# Patient Record
Sex: Male | Born: 1970 | Race: Black or African American | Hispanic: No | Marital: Single | Smoking: Never smoker
Health system: Southern US, Community
[De-identification: ages and names within clinical notes are randomized; demographics above are authoritative.]

---

## 2004-10-18 ENCOUNTER — Ambulatory Visit: Payer: Self-pay | Admitting: Physical Medicine & Rehabilitation

## 2004-10-18 ENCOUNTER — Inpatient Hospital Stay (HOSPITAL_COMMUNITY): Admission: AC | Admit: 2004-10-18 | Discharge: 2004-10-23 | Payer: Self-pay

## 2004-10-23 ENCOUNTER — Inpatient Hospital Stay (HOSPITAL_COMMUNITY)
Admission: RE | Admit: 2004-10-23 | Discharge: 2004-10-29 | Payer: Self-pay | Admitting: Physical Medicine & Rehabilitation

## 2004-12-16 ENCOUNTER — Encounter
Admission: RE | Admit: 2004-12-16 | Discharge: 2005-03-16 | Payer: Self-pay | Admitting: Physical Medicine & Rehabilitation

## 2004-12-16 ENCOUNTER — Ambulatory Visit: Payer: Self-pay | Admitting: Physical Medicine & Rehabilitation

## 2008-02-01 ENCOUNTER — Emergency Department (HOSPITAL_COMMUNITY): Admission: EM | Admit: 2008-02-01 | Discharge: 2008-02-01 | Payer: Self-pay | Admitting: Emergency Medicine

## 2008-04-01 ENCOUNTER — Emergency Department (HOSPITAL_COMMUNITY): Admission: EM | Admit: 2008-04-01 | Discharge: 2008-04-01 | Payer: Self-pay | Admitting: Emergency Medicine

## 2008-10-26 ENCOUNTER — Emergency Department (HOSPITAL_COMMUNITY): Admission: EM | Admit: 2008-10-26 | Discharge: 2008-10-26 | Payer: Self-pay | Admitting: Emergency Medicine

## 2009-11-04 ENCOUNTER — Emergency Department (HOSPITAL_COMMUNITY): Admission: EM | Admit: 2009-11-04 | Discharge: 2009-11-04 | Payer: Self-pay | Admitting: Emergency Medicine

## 2011-08-05 ENCOUNTER — Encounter (HOSPITAL_COMMUNITY): Payer: Self-pay | Admitting: Emergency Medicine

## 2011-08-05 ENCOUNTER — Emergency Department (HOSPITAL_COMMUNITY)
Admission: EM | Admit: 2011-08-05 | Discharge: 2011-08-05 | Disposition: A | Discharge status: Home / Self Care | Payer: Self-pay | Attending: Emergency Medicine | Admitting: Emergency Medicine

## 2011-08-05 DIAGNOSIS — R369 Urethral discharge, unspecified: Secondary | ICD-10-CM | POA: Insufficient documentation

## 2011-08-05 DIAGNOSIS — R599 Enlarged lymph nodes, unspecified: Secondary | ICD-10-CM | POA: Insufficient documentation

## 2011-08-05 DIAGNOSIS — Z202 Contact with and (suspected) exposure to infections with a predominantly sexual mode of transmission: Secondary | ICD-10-CM | POA: Insufficient documentation

## 2011-08-05 MED ORDER — AZITHROMYCIN 250 MG PO TABS
1000.0000 mg | ORAL_TABLET | Freq: Once | ORAL | Status: AC
  Administered 2011-08-05: 1000 mg via ORAL
  Filled 2011-08-05: qty 1

## 2011-08-05 MED ORDER — LIDOCAINE HCL 2 % IJ SOLN
1.5000 mL | Freq: Once | INTRAMUSCULAR | Status: AC
  Administered 2011-08-05: 1.5 mg

## 2011-08-05 MED ORDER — CEFTRIAXONE SODIUM 250 MG IJ SOLR
250.0000 mg | Freq: Once | INTRAMUSCULAR | Status: AC
  Administered 2011-08-05: 250 mg via INTRAMUSCULAR
  Filled 2011-08-05: qty 250

## 2011-08-05 NOTE — ED Provider Notes (Signed)
History     CSN: [id number]  Arrival date & time 08/05/11  1341   First MD Initiated Contact with Patient 08/05/11 1508      Chief Complaint  Patient presents with  . Exposure to STD    1 week post exposure, "warm sensation around penis"    (Consider location/radiation/quality/duration/timing/severity/associated sxs/prior treatment) HPI Comments: Patient reports that he is concerned that he may have a STD.  He is sexually active.  No known STD exposure.  He has had a whitish color penile discharge for the past week.  He also describes a "warm sensation".  He denies dysuria or hematuria.  No testicular or penile pain.  No fever or chills.  PMH significant for Gonorrhea.    Patient is a 41 y.o. male presenting with penile discharge. The history is provided by the patient.  Penile Discharge This is a new problem. The current episode started in the past 7 days. The problem has been unchanged. Pertinent negatives include no abdominal pain, chills, fever, myalgias, nausea, rash, urinary symptoms or vomiting. He has tried nothing for the symptoms.    History reviewed. No pertinent past medical history.  History reviewed. No pertinent past surgical history.  Family History  Problem Relation Age of Onset  . Hypertension Mother     History  Substance Use Topics  . Smoking status: Never Smoker   . Smokeless tobacco: Not on file  . Alcohol Use: Yes      Review of Systems  Constitutional: Negative for fever and chills.  Gastrointestinal: Negative for nausea, vomiting and abdominal pain.  Genitourinary: Positive for discharge. Negative for dysuria, urgency, frequency, hematuria, flank pain, decreased urine volume, penile swelling, scrotal swelling, difficulty urinating, genital sores, penile pain and testicular pain.  Musculoskeletal: Negative for myalgias.  Skin: Negative for rash.    Allergies  Review of patient's allergies indicates no known allergies.  Home Medications  No  current outpatient prescriptions on file.  BP 127/84  Pulse 63  Temp 97.9 F (36.6 C) (Oral)  Resp 18  SpO2 99%  Physical Exam  Nursing note and vitals reviewed. Constitutional: He appears well-developed and well-nourished. No distress.  HENT:  Head: Normocephalic and atraumatic.  Mouth/Throat: Oropharynx is clear and moist.  Cardiovascular: Normal rate, regular rhythm and normal heart sounds.   Pulmonary/Chest: Effort normal and breath sounds normal.  Abdominal: Soft. There is no tenderness.  Genitourinary: Testes normal and penis normal. Right testis shows no mass, no swelling and no tenderness. Right testis is descended. Left testis shows no mass, no swelling and no tenderness. Left testis is descended. Circumcised. No penile erythema or penile tenderness. No discharge found.  Lymphadenopathy:       Right: Inguinal adenopathy present.       Left: Inguinal adenopathy present.  Neurological: He is alert.  Skin: Skin is warm and dry. No rash noted. He is not diaphoretic.  Psychiatric: He has a normal mood and affect.    ED Course  Procedures (including critical care time)  Labs Reviewed - No data to display No results found.   No diagnosis found.    MDM  Patient presents with a chief complaint of penile discharge and concern that he may have a STD.  PMH significant for Gonorrhea.  Patient given prophylactic treatment with Azithromycin and Ceftriaxone.  GC/Chlamydia pending.        Pascal Lux [place], PA-C 08/05/11 1809

## 2011-08-05 NOTE — ED Notes (Signed)
Pt is concerned about STD exposure, pain in penis and warm sensation

## 2011-08-06 LAB — GC/CHLAMYDIA PROBE AMP, GENITAL
Chlamydia, DNA Probe: NEGATIVE
GC Probe Amp, Genital: NEGATIVE

## 2011-08-08 NOTE — ED Provider Notes (Signed)
Medical screening examination/treatment/procedure(s) were performed by non-physician practitioner and as supervising physician I was immediately available for consultation/collaboration.    Nelia Shi, MD 08/08/11 1030

## 2012-12-18 ENCOUNTER — Encounter (HOSPITAL_COMMUNITY): Payer: Self-pay | Admitting: Emergency Medicine

## 2012-12-18 ENCOUNTER — Emergency Department (HOSPITAL_COMMUNITY): Payer: Self-pay

## 2012-12-18 ENCOUNTER — Emergency Department (HOSPITAL_COMMUNITY)
Admission: EM | Admit: 2012-12-18 | Discharge: 2012-12-18 | Disposition: A | Discharge status: Home / Self Care | Payer: Self-pay | Attending: Emergency Medicine | Admitting: Emergency Medicine

## 2012-12-18 DIAGNOSIS — Y9389 Activity, other specified: Secondary | ICD-10-CM | POA: Insufficient documentation

## 2012-12-18 DIAGNOSIS — Y929 Unspecified place or not applicable: Secondary | ICD-10-CM | POA: Insufficient documentation

## 2012-12-18 DIAGNOSIS — S43499A Other sprain of unspecified shoulder joint, initial encounter: Secondary | ICD-10-CM | POA: Insufficient documentation

## 2012-12-18 DIAGNOSIS — X500XXA Overexertion from strenuous movement or load, initial encounter: Secondary | ICD-10-CM | POA: Insufficient documentation

## 2012-12-18 DIAGNOSIS — S46211A Strain of muscle, fascia and tendon of other parts of biceps, right arm, initial encounter: Secondary | ICD-10-CM

## 2012-12-18 MED ORDER — CYCLOBENZAPRINE HCL 10 MG PO TABS
10.0000 mg | ORAL_TABLET | Freq: Once | ORAL | Status: AC
  Administered 2012-12-18: 10 mg via ORAL
  Filled 2012-12-18: qty 1

## 2012-12-18 MED ORDER — TRAMADOL HCL 50 MG PO TABS
50.0000 mg | ORAL_TABLET | Freq: Once | ORAL | Status: AC
  Administered 2012-12-18: 50 mg via ORAL
  Filled 2012-12-18: qty 1

## 2012-12-18 MED ORDER — TRAMADOL HCL 50 MG PO TABS: 50.0000 mg | ORAL_TABLET | Freq: Four times a day (QID) | ORAL | Status: DC | PRN

## 2012-12-18 MED ORDER — CYCLOBENZAPRINE HCL 10 MG PO TABS: 10.0000 mg | ORAL_TABLET | Freq: Two times a day (BID) | ORAL | Status: DC | PRN

## 2012-12-18 NOTE — ED Provider Notes (Signed)
CSN: [id number]     Arrival date & time 12/18/12  1611 History  This chart was scribed for Emilia Beck, PA working with Toy Baker, MD by Quintella Reichert, ED Scribe. This patient was seen in room WTR8/WTR8 and the patient's care was started at 4:46 PM.   Chief Complaint  Patient presents with  . Arm Pain    The history is provided by the patient. No language interpreter was used.    HPI Comments: RILEY HALLUM is a 42 y.o. male who presents to the Emergency Department complaining of a right arm injury sustained yesterday.  Pt states that he was trying to pick up his daughter when he felt a pop in his right bicep and "my muscles went into a ball."  Since then he has had constant moderate-to-severe pain to the right bicep that is worsened by flexing the bicep.  He denies pain or injury to any other area.  He denies prior h/o similar symptoms.   History reviewed. No pertinent past medical history.  No past surgical history on file.   Family History  Problem Relation Age of Onset  . Hypertension Mother     History  Substance Use Topics  . Smoking status: Never Smoker   . Smokeless tobacco: Never Used  . Alcohol Use: Yes     Comment: weekends only     Review of Systems  All other systems reviewed and are negative.     Allergies  Review of patient's allergies indicates no known allergies.  Home Medications  No current outpatient prescriptions on file.  BP 158/90  Pulse 75  Temp(Src) 98.6 F (37 C) (Oral)  Resp 16  SpO2 99%  Physical Exam  Nursing note and vitals reviewed. Constitutional: He is oriented to person, place, and time. He appears well-developed and well-nourished. No distress.  HENT:  Head: Normocephalic and atraumatic.  Eyes: EOM are normal.  Neck: Neck supple. No tracheal deviation present.  Cardiovascular: Normal rate.   Pulmonary/Chest: Effort normal. No respiratory distress.  Musculoskeletal:  Right arm ROM at elbow limited due  to pain.  Bicep tenderness to palpation.  Proximal swelling of bicep muscle.  Neurological: He is alert and oriented to person, place, and time.  Skin: Skin is warm and dry.  Psychiatric: He has a normal mood and affect. His behavior is normal.    ED Course  Procedures (including critical care time)  DIAGNOSTIC STUDIES: Oxygen Saturation is 99% on room air, normal by my interpretation.    COORDINATION OF CARE: 4:49 PM-Discussed treatment plan which includes pain medication and orthopedic f/u with pt at bedside and pt agreed to plan.    Labs Review Labs Reviewed - No data to display  Imaging Review No results found.  EKG Interpretation   None       MDM   1. Biceps tendon rupture, right, initial encounter    Patient likely has a distal bicep tendon rupture. Patient will have a sling immobilizer and Flexeril and Tramadol and instructions to follow up with orthopedics. No neurovascular compromise. Vitals stable and patient afebrile.    I personally performed the services described in this documentation, which was scribed in my presence. The recorded information has been reviewed and is accurate.    Emilia Beck, PA-C 12/18/12 1921

## 2012-12-18 NOTE — ED Notes (Addendum)
Patient reports that he attempted to pick up his daughter he had severe pain in the right bicep. Patient able to wiggle fingers without any problems. Right radial pulse present.

## 2012-12-20 NOTE — ED Provider Notes (Signed)
Medical screening examination/treatment/procedure(s) were performed by non-physician practitioner and as supervising physician I was immediately available for consultation/collaboration.  Tenzin Edelman T Bard Haupert, MD 12/20/12 0941 

## 2014-08-31 ENCOUNTER — Encounter (HOSPITAL_COMMUNITY): Payer: Self-pay | Admitting: *Deleted

## 2014-08-31 ENCOUNTER — Emergency Department (HOSPITAL_COMMUNITY)
Admission: EM | Admit: 2014-08-31 | Discharge: 2014-08-31 | Disposition: A | Discharge status: Home / Self Care | Payer: No Typology Code available for payment source | Attending: Emergency Medicine | Admitting: Emergency Medicine

## 2014-08-31 ENCOUNTER — Emergency Department (HOSPITAL_COMMUNITY): Payer: No Typology Code available for payment source

## 2014-08-31 DIAGNOSIS — S40261A Insect bite (nonvenomous) of right shoulder, initial encounter: Secondary | ICD-10-CM | POA: Diagnosis not present

## 2014-08-31 DIAGNOSIS — S62613A Displaced fracture of proximal phalanx of left middle finger, initial encounter for closed fracture: Secondary | ICD-10-CM | POA: Diagnosis not present

## 2014-08-31 DIAGNOSIS — S40862A Insect bite (nonvenomous) of left upper arm, initial encounter: Secondary | ICD-10-CM | POA: Diagnosis not present

## 2014-08-31 DIAGNOSIS — S6992XA Unspecified injury of left wrist, hand and finger(s), initial encounter: Secondary | ICD-10-CM | POA: Diagnosis present

## 2014-08-31 DIAGNOSIS — W57XXXA Bitten or stung by nonvenomous insect and other nonvenomous arthropods, initial encounter: Secondary | ICD-10-CM | POA: Insufficient documentation

## 2014-08-31 DIAGNOSIS — S40861A Insect bite (nonvenomous) of right upper arm, initial encounter: Secondary | ICD-10-CM | POA: Diagnosis not present

## 2014-08-31 DIAGNOSIS — S40262A Insect bite (nonvenomous) of left shoulder, initial encounter: Secondary | ICD-10-CM | POA: Insufficient documentation

## 2014-08-31 DIAGNOSIS — Y9389 Activity, other specified: Secondary | ICD-10-CM | POA: Diagnosis not present

## 2014-08-31 DIAGNOSIS — S30860A Insect bite (nonvenomous) of lower back and pelvis, initial encounter: Secondary | ICD-10-CM | POA: Diagnosis not present

## 2014-08-31 DIAGNOSIS — Y92410 Unspecified street and highway as the place of occurrence of the external cause: Secondary | ICD-10-CM | POA: Diagnosis not present

## 2014-08-31 DIAGNOSIS — Y998 Other external cause status: Secondary | ICD-10-CM | POA: Insufficient documentation

## 2014-08-31 DIAGNOSIS — S62609A Fracture of unspecified phalanx of unspecified finger, initial encounter for closed fracture: Secondary | ICD-10-CM

## 2014-08-31 MED ORDER — IBUPROFEN 800 MG PO TABS: 800.0000 mg | ORAL_TABLET | Freq: Three times a day (TID) | ORAL | Status: DC

## 2014-08-31 MED ORDER — PERMETHRIN 5 % EX CREA: TOPICAL_CREAM | CUTANEOUS | Status: DC

## 2014-08-31 NOTE — ED Notes (Signed)
Declined W/C at D/C and was escorted to lobby by RN. 
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
## 2014-08-31 NOTE — Discharge Instructions (Signed)
Finger Fracture (Phalangeal) A broken bone of the finger (phalangealfracture) is a common injury for athletes. A single injury (trauma) is likely to fracture multiple bones on the same or different fingers. SYMPTOMS   Severe pain, at the time of injury.  Pain, tenderness, swelling, and later bruising of the finger and then the hand.  Visible deformity, if the fracture is complete and the bone fragments separate enough to distort the normal shape.  Numbness or coldness from swelling in the finger, causing pressure on blood vessels or nerves (uncommon). CAUSES  Direct or indirect injury (trauma) to the finger.  RISK INCREASES WITH:   Contact sports (football, rugby) or other sports where injury to the hand is likely (soccer, baseball, basketball).  Sports that require hitting (boxing, martial arts).  History of bone or joint disease, such as osteoporosis, or previous bone restraint.  Poor hand strength and flexibility. PREVENTION   For contact sports, wear appropriate and properly fitted protective equipment for the hand.  Learn and use proper technique when hitting, punching, or landing after a fall.  If you had a previous finger injury or hand restraint, use tape or padding to protect the finger when playing sports where finger injury is likely. PROGNOSIS  With proper treatment and normal alignment of the bones, healing can usually be expected in 4 to 6 weeks. Sometimes, surgery is needed.  RELATED COMPLICATIONS   Fracture does not heal (nonunion).  Bone heals in wrong position (malunion).  Chronic pain, stiffness, or swelling of the hand.  Excessive bleeding, causing pressure on nerves and blood vessels.  Unstable or arthritic joint, following repeated injury or delayed treatment.  Hindrance of normal growth in children.  Infection in skin broken over the fracture (open fracture) or at the incision or pin sites from surgery.  Shortening of injured bones.  Bony bumps  or loss of shape of the fingers.  Arthritic or stiff finger joint, if the fracture reaches the joint. TREATMENT  If the bones are properly aligned, treatment involves ice and medicine to reduce pain and inflammation. Then, the finger is restrained for 4 or more weeks, to allow for healing. If the fracture is out of alignment (displaced), involves more than one bone, or involves a joint, surgery is usually advised. Surgery often involves placing removable pins, screws, and sometimes plates, to hold the bones in proper alignment. After restraint (with or without surgery), stretching and strengthening exercises are needed. Exercises may be completed at home or with a therapist. For certain sports, wearing a splint or having the finger taped during future activity is advised.  MEDICATION   If pain medicine is needed, nonsteroidal anti-inflammatory medicines (aspirin and ibuprofen), or other minor pain relievers (acetaminophen), are often advised.  Do not take pain medicine for 7 days before surgery.  Prescription pain relievers are usually prescribed only after surgery. Use only as directed and only as much as you need. COLD THERAPY   Cold treatment (icing) relieves pain and reduces inflammation. Cold treatment should be applied for 10 to 15 minutes every 2 to 3 hours, and immediately after activity that aggravates your symptoms. Use ice packs or an ice massage. SEEK MEDICAL CARE IF:   Pain, tenderness, or swelling gets worse, despite treatment.  You experience pain, numbness, or coldness in the hand.  Blue, gray, or dark color appears in the fingernails.  Any of the following occur after surgery: fever, increased pain, swelling, redness, drainage of fluids, or bleeding in the affected area.  New,   unexplained symptoms develop. (Drugs used in treatment may produce side effects.) Document Released: 01/11/2005 Document Revised: 04/05/2011 Document Reviewed: 04/25/2008 Exit[employer] Patient  Information 2015 [employer]. This information is not intended to replace advice given to you by your health care provider. Make sure you discuss any questions you have with your health care provider.  

## 2014-09-11 ENCOUNTER — Ambulatory Visit: Payer: Self-pay

## 2014-09-11 ENCOUNTER — Other Ambulatory Visit: Payer: Self-pay | Admitting: Occupational Medicine

## 2014-09-11 DIAGNOSIS — R52 Pain, unspecified: Secondary | ICD-10-CM

## 2016-07-21 ENCOUNTER — Ambulatory Visit (HOSPITAL_COMMUNITY)
Admission: EM | Admit: 2016-07-21 | Discharge: 2016-07-21 | Disposition: A | Discharge status: Home / Self Care | Payer: Self-pay | Attending: Family Medicine | Admitting: Family Medicine

## 2016-07-21 ENCOUNTER — Encounter (HOSPITAL_COMMUNITY): Payer: Self-pay | Admitting: Emergency Medicine

## 2016-07-21 DIAGNOSIS — T63441A Toxic effect of venom of bees, accidental (unintentional), initial encounter: Secondary | ICD-10-CM

## 2016-07-21 DIAGNOSIS — S60561A Insect bite (nonvenomous) of right hand, initial encounter: Secondary | ICD-10-CM

## 2016-07-21 MED ORDER — KETOROLAC TROMETHAMINE 30 MG/ML IJ SOLN
INTRAMUSCULAR | Status: AC
  Filled 2016-07-21: qty 1

## 2016-07-21 MED ORDER — DEXAMETHASONE SODIUM PHOSPHATE 10 MG/ML IJ SOLN
10.0000 mg | Freq: Once | INTRAMUSCULAR | Status: AC
  Administered 2016-07-21: 10 mg via INTRAMUSCULAR

## 2016-07-21 MED ORDER — HYDROXYZINE HCL 25 MG PO TABS: 25.0000 mg | ORAL_TABLET | Freq: Four times a day (QID) | ORAL | 0 refills | Status: DC

## 2016-07-21 MED ORDER — KETOROLAC TROMETHAMINE 30 MG/ML IJ SOLN
30.0000 mg | Freq: Once | INTRAMUSCULAR | Status: AC
  Administered 2016-07-21: 30 mg via INTRAMUSCULAR

## 2016-07-21 MED ORDER — DEXAMETHASONE SODIUM PHOSPHATE 10 MG/ML IJ SOLN
INTRAMUSCULAR | Status: AC
  Filled 2016-07-21: qty 1

## 2016-07-21 NOTE — ED Provider Notes (Signed)
CSN: [id number]     Arrival date & time 07/21/16  1333 History   First MD Initiated Contact with Patient 07/21/16 1349     Chief Complaint  Patient presents with  . Insect Bite   (Consider location/radiation/quality/duration/timing/severity/associated sxs/prior Treatment) Patient c/o being stung by a wasp today and has swelling in his right hand and some pain.  He denies SOB or swelling in mouth or throat.   The history is provided by the patient.  Rash  Location:  Hand Hand rash location:  R hand Quality: itchiness and swelling   Onset quality:  Sudden Duration:  4 hours Timing:  Constant Progression:  Waxing and waning Chronicity:  New Context: insect bite/sting   Relieved by:  None tried   History reviewed. No pertinent past medical history. History reviewed. No pertinent surgical history. Family History  Problem Relation Age of Onset  . Hypertension Mother    Social History  Substance Use Topics  . Smoking status: Never Smoker  . Smokeless tobacco: Never Used  . Alcohol use Yes     Comment: weekends only    Review of Systems  Constitutional: Negative.   HENT: Negative.   Eyes: Negative.   Respiratory: Negative.   Cardiovascular: Negative.   Gastrointestinal: Negative.   Endocrine: Negative.   Genitourinary: Negative.   Skin: Positive for rash.  Allergic/Immunologic: Negative.   Neurological: Negative.   Hematological: Negative.   Psychiatric/Behavioral: Negative.     Allergies  Patient has no known allergies.  Home Medications   Prior to Admission medications   Medication Sig Start Date End Date Taking? Authorizing Provider  hydrOXYzine (ATARAX/VISTARIL) 25 MG tablet Take 1 tablet (25 mg total) by mouth every 6 (six) hours. 07/21/16   Deatra Canterxford, Daijah Scrivens J, FNP   Meds Ordered and Administered this Visit   Medications  ketorolac (TORADOL) 30 MG/ML injection 30 mg (30 mg Intramuscular Given 07/21/16 1400)  dexamethasone (DECADRON) injection 10 mg (10 mg  Intramuscular Given 07/21/16 1359)    BP 122/76 (BP Location: Left Arm)   Pulse 73   Temp 98.5 F (36.9 C) (Oral)   Resp 18   SpO2 100%  No data found.   Physical Exam  Constitutional: He appears well-developed and well-nourished.  HENT:  Head: Normocephalic and atraumatic.  Eyes: Conjunctivae and EOM are normal. Pupils are equal, round, and reactive to light.  Neck: Normal range of motion. Neck supple.  Cardiovascular: Normal rate, regular rhythm and normal heart sounds.   Pulmonary/Chest: Effort normal and breath sounds normal.  Skin: Rash noted.  Right hand swollen painful and itchy  Nursing note and vitals reviewed.   Urgent Care Course     Procedures (including critical care time)  Labs Review Labs Reviewed - No data to display  Imaging Review No results found.   Visual Acuity Review  Right Eye Distance:   Left Eye Distance:   Bilateral Distance:    Right Eye Near:   Left Eye Near:    Bilateral Near:         MDM   1. Bee sting, accidental or unintentional, initial encounter    Toradol 30mg  IM Dexamethasone 10mg  IM Hydroxyzine 25mg  one po qid prn #12      Deatra CanterOxford, Turon Kilmer J, FNP 07/21/16 1432

## 2016-07-21 NOTE — ED Triage Notes (Signed)
[facility] with a complaint of pain and swelling to his right hand secondary to being stung by a wasp earlier this morning.

## 2017-02-17 ENCOUNTER — Other Ambulatory Visit: Payer: Self-pay

## 2017-02-17 DIAGNOSIS — Y990 Civilian activity done for income or pay: Secondary | ICD-10-CM | POA: Insufficient documentation

## 2017-02-17 DIAGNOSIS — X500XXA Overexertion from strenuous movement or load, initial encounter: Secondary | ICD-10-CM | POA: Insufficient documentation

## 2017-02-17 DIAGNOSIS — Y9289 Other specified places as the place of occurrence of the external cause: Secondary | ICD-10-CM | POA: Insufficient documentation

## 2017-02-17 DIAGNOSIS — Y9389 Activity, other specified: Secondary | ICD-10-CM | POA: Insufficient documentation

## 2017-02-17 DIAGNOSIS — S46912A Strain of unspecified muscle, fascia and tendon at shoulder and upper arm level, left arm, initial encounter: Secondary | ICD-10-CM | POA: Insufficient documentation

## 2017-02-17 DIAGNOSIS — Z79899 Other long term (current) drug therapy: Secondary | ICD-10-CM | POA: Insufficient documentation

## 2017-02-18 ENCOUNTER — Other Ambulatory Visit: Payer: Self-pay

## 2017-02-18 ENCOUNTER — Encounter (HOSPITAL_COMMUNITY): Payer: Self-pay | Admitting: Emergency Medicine

## 2017-02-18 ENCOUNTER — Emergency Department (HOSPITAL_COMMUNITY): Payer: Self-pay

## 2017-02-18 ENCOUNTER — Emergency Department (HOSPITAL_COMMUNITY)
Admission: EM | Admit: 2017-02-18 | Discharge: 2017-02-18 | Disposition: A | Discharge status: Home / Self Care | Payer: Self-pay | Attending: Emergency Medicine | Admitting: Emergency Medicine

## 2017-02-18 DIAGNOSIS — S46912A Strain of unspecified muscle, fascia and tendon at shoulder and upper arm level, left arm, initial encounter: Secondary | ICD-10-CM

## 2017-02-18 MED ORDER — TRAMADOL HCL 50 MG PO TABS: 50.0000 mg | ORAL_TABLET | Freq: Four times a day (QID) | ORAL | 0 refills | Status: DC | PRN

## 2017-02-18 MED ORDER — IBUPROFEN 800 MG PO TABS
800.0000 mg | ORAL_TABLET | Freq: Once | ORAL | Status: AC
Start: 2017-02-18 — End: 2017-02-18
  Administered 2017-02-18: 800 mg via ORAL
  Filled 2017-02-18: qty 1

## 2017-02-18 NOTE — ED Notes (Signed)
No answer x 1 for triage

## 2017-02-18 NOTE — Discharge Instructions (Signed)
You may alternate Tylenol 1000 mg every 6 hours as needed for pain and Ibuprofen 800 mg every 8 hours as needed for pain.  Please take Ibuprofen with food. ° ° ° °To find a primary care or specialty doctor please call [phone] or [phone] to access "[facility] Find a Doctor Service." ° °You may also go on the [facility] website at [email or web address] ° °There are also multiple [facility], [facility], [facility] and [facility] practices throughout the [place] that are frequently accepting new patients. You may find a clinic that is close to your home and contact them. ° °[facility] -  °[address] °[place] [address] °[phone] ° ° °[facility] -  °[address] °[place] [address] °[phone] ° ° °[facility] - °[address]  °[place] [address] °[phone] ° ° °

## 2017-02-18 NOTE — ED Triage Notes (Signed)
Pt reports he was working today and began to experience left shoulder pain.  The pain got so bad he had to leave work.

## 2017-02-18 NOTE — ED Provider Notes (Signed)
TIME SEEN: 4:45 AM  CHIEF COMPLAINT: Left shoulder pain  HPI: Patient is a 47 year old right-hand-dominant male with no significant past medical history who presents to the emergency department with left shoulder pain.  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  He has a hard time lifting his left arm as it causes him increased pain.  Denies chest pain or shortness of breath.  No numbness, tingling or weakness.  No swelling in this arm.  No other new injury.  Did not take any medications prior to arrival.  ROS: See HPI Constitutional: no fever  Eyes: no drainage  ENT: no runny nose   Cardiovascular:  no chest pain  Resp: no SOB  GI: no vomiting GU: no dysuria Integumentary: no rash  Allergy: no hives  Musculoskeletal: no leg swelling  Neurological: no slurred speech ROS otherwise negative  PAST MEDICAL HISTORY/PAST SURGICAL HISTORY:  History reviewed. No pertinent past medical history.  MEDICATIONS:  Prior to Admission medications   Medication Sig Start Date End Date Taking? Authorizing Provider  hydrOXYzine (ATARAX/VISTARIL) 25 MG tablet Take 1 tablet (25 mg total) by mouth every 6 (six) hours. 07/21/16   Deatra Canterxford, William J, FNP    ALLERGIES:  No Known Allergies  SOCIAL HISTORY:  Social History   Tobacco Use  . Smoking status: Never Smoker  . Smokeless tobacco: Never Used  Substance Use Topics  . Alcohol use: Yes    Comment: weekends only    FAMILY HISTORY: Family History  Problem Relation Age of Onset  . Hypertension Mother     EXAM: BP 135/87 (BP Location: Right Arm)   Pulse 80   Temp 98.2 F (36.8 C) (Oral)   Resp 16   SpO2 95%  CONSTITUTIONAL: Alert and oriented and responds appropriately to questions. Well-appearing; well-nourished; GCS 15 HEAD: Normocephalic; atraumatic EYES: Conjunctivae clear, PERRL, EOMI ENT: normal nose; no rhinorrhea; moist mucous membranes; pharynx without lesions noted; no dental injury; no septal hematoma NECK:  Supple, no meningismus, no LAD; no midline spinal tenderness, step-off or deformity; trachea midline CARD: RRR; S1 and S2 appreciated; no murmurs, no clicks, no rubs, no gallops RESP: Normal chest excursion without splinting or tachypnea; breath sounds clear and equal bilaterally; no wheezes, no rhonchi, no rales; no hypoxia or respiratory distress CHEST:  chest wall stable, no crepitus or ecchymosis or deformity, mildly tender to palpation over the left pectoralis muscle without swelling, erythema, ecchymosis, warmth; no flail chest ABD/GI: Normal bowel sounds; non-distended; soft, non-tender, no rebound, no guarding; no ecchymosis or other lesions noted PELVIS:  stable, nontender to palpation BACK:  The back appears normal and is non-tender to palpation, there is no CVA tenderness; no midline spinal tenderness, step-off or deformity EXT: Tender to palpation over the anterior left shoulder without deformity.  2+ radial pulses bilaterally.  Compartments are soft.  Patient has pain with lifting the shoulder all the way up to his head but has full range of motion in this joint.  Otherwise normal ROM in all joints; otherwise extremities are non-tender to palpation; no edema; normal capillary refill; no cyanosis, no bony tenderness or bony deformity of patient's extremities, no joint effusion, compartments are soft, extremities are warm and well-perfused, no ecchymosis SKIN: Normal color for age and race; warm NEURO: Moves all extremities equally PSYCH: The patient's mood and manner are appropriate. Grooming and personal hygiene are appropriate.  MEDICAL DECISION MAKING: Patient here with left shoulder strain, sprain.  Also suspect pectoralis strain.  X-ray shows no  fracture.  No sign of dislocation.  Neurovascular intact distally.  No sign of gout, septic arthritis.  Doubt DVT or arterial obstruction.  Recommended NSAIDs, rest, stretching, ice.  We will also discharge with brief prescription of tramadol for  pain control.  Will give outpatient PCP follow-up if symptoms not improving.  I do not think this is his anginal equivalent.  Doubt ACS, PE, dissection.   At this time, I do not feel there is any life-threatening condition present. I have reviewed and discussed all results (EKG, imaging, lab, urine as appropriate) and exam findings with patient/family. I have reviewed nursing notes and appropriate previous records.  I feel the patient is safe to be discharged home without further emergent workup and can continue workup as an outpatient as needed. Discussed usual and customary return precautions. Patient/family verbalize understanding and are comfortable with this plan.  Outpatient follow-up has been provided if needed. All questions have been answered.       Jamir Rone, Layla Maw, DO 02/18/17 [phone]

## 2018-04-04 ENCOUNTER — Encounter (HOSPITAL_COMMUNITY): Payer: Self-pay

## 2018-04-04 ENCOUNTER — Ambulatory Visit (HOSPITAL_COMMUNITY)
Admission: EM | Admit: 2018-04-04 | Discharge: 2018-04-04 | Disposition: A | Discharge status: Home / Self Care | Payer: BLUE CROSS/BLUE SHIELD | Attending: Emergency Medicine | Admitting: Emergency Medicine

## 2018-04-04 DIAGNOSIS — R040 Epistaxis: Secondary | ICD-10-CM | POA: Diagnosis not present

## 2018-04-04 NOTE — Discharge Instructions (Addendum)
Apply bacitracin in your nose to keep nasal passages moist.  apply pressure if the nosebleed returns for 15 minutes, and if it does not resolve, then use 2 sprays of Afrin in each nostril and apply pressure for 30 minutes.  If it does not stop bleeding by then, go to the ER.  Below is a list of primary care practices who are taking new patients for you to follow-up with.  [facility] at [facility] [address] [place], [place] [address] [phone]  [facility] [address] [place], [place] [address] [phone]  [facility] [phone] [address] [place] [address]  [facility]: [address] [place] [address]  [phone]  [facility]: [address] [place] [address]   [phone]  [facility]: [address] [place] [address]  [phone]  [facility] : [address] [place] [address] [phone]  [facility]: [address] [place] [address] [phone]  [facility]: [address] [place] [address] [phone]  Dr. Oneal Grout [address] [facility] [place] [address]  [phone]  Dr. Jackie Plum, [facility]. [address] [place], [place] [address]  [phone]  Go to www.goodrx.com to look up your medications. This will give you a list of where you can find your prescriptions at the most affordable prices. Or ask the pharmacist what the cash price is, or if they have any other discount programs available to help make your medication more affordable. This can be less expensive than what you would pay with insurance.

## 2018-04-04 NOTE — ED Provider Notes (Signed)
HPI  SUBJECTIVE:  Paul [name] is a 48 y.o. male who presents with 3 episodes of epistaxis lasting 7 to 8 minutes each since yesterday.  Patient states that he was working when it started.  States that he had the third episode of epistaxis after blowing his nose.  He denies trauma to the nose, finger picking, headache, fevers, vomiting.  No nasal sprays such as Flonase, denies cocaine use.  States that he has had a recent URI.  He tried pinching his nose and tilting his head backwards with hemostasis.  No aggravating factors.  He denies easy bruising, hematuria, melena, hematochezia, petechial rash.  He has a past medical history of epistaxis, but never sought medical attention for this.  It does not appear to be associated with cold weather.  No history of hypertension, anticoagulant, antiplatelet use.  PMD: None.    History reviewed. No pertinent past medical history.  History reviewed. No pertinent surgical history.  Family History  Problem Relation Age of Onset  . Hypertension Mother     Social History   Tobacco Use  . Smoking status: Never Smoker  . Smokeless tobacco: Never Used  Substance Use Topics  . Alcohol use: Yes    Comment: weekends only  . Drug use: No    No current facility-administered medications for this encounter.  No current outpatient medications on file.  No Known Allergies   ROS  As noted in HPI.   Physical Exam  BP (!) 132/95 (BP Location: Right Arm)   Pulse 67   Temp 98.1 F (36.7 C) (Oral)   Resp 18   SpO2 98%   Constitutional: Well developed, well nourished, no acute distress Eyes:  EOMI, conjunctiva normal bilaterally HENT: Normocephalic, atraumatic,mucus membranes moist.  Friable, erythematous nasal mucosa.  No active bleeding.  No blood in the oropharynx..  Septum intact. Respiratory: Normal inspiratory effort Cardiovascular: Normal rate GI: nondistended skin: No rash, skin intact Musculoskeletal: no deformities Neurologic: Alert  & oriented x 3, no focal neuro deficits Psychiatric: Speech and behavior appropriate   ED Course   Medications - No data to display  No orders of the defined types were placed in this encounter.   No results found for this or any previous visit (from the past 24 hour(s)). No results found.  ED Clinical Impression  Anterior epistaxis   ED Assessment/Plan  he has no active bleeding at this point in time.  He is not hypertensive nor does he give any historical evidence of a coagulopathy.  Suspect that this is from his recent URI.  We will have him apply some bacitracin his nose to keep nasal passages moist, apply pressure if the epistaxis returns for 15 minutes, and if it does not resolve after 15 minutes, then to use 2 sprays of Afrin in each nostril and apply pressure for 30 minutes.  If it does not stop bleeding by then, he is to go to the ER.  We will have him follow-up with ENT if this becomes a recurrent problem.  Dr. Annalee Genta on call.  Also providing a primary care list for routine care.  Discussed MDM, treatment plan, and plan for follow-up with patient. Discussed sn/sx that should prompt return to the ED. patient agrees with plan.   No orders of the defined types were placed in this encounter.   *This clinic note was created using Dragon dictation software. Therefore, there may be occasional mistakes despite careful proofreading.   ?   Domenick Gong, MD  04/04/18 1827  

## 2018-04-04 NOTE — ED Triage Notes (Signed)
Pt c/o nosebleed x2 today

## 2018-08-29 ENCOUNTER — Encounter (HOSPITAL_COMMUNITY): Payer: Self-pay

## 2018-08-29 ENCOUNTER — Emergency Department (HOSPITAL_COMMUNITY)
Admission: EM | Admit: 2018-08-29 | Discharge: 2018-08-29 | Disposition: A | Discharge status: Home / Self Care | Payer: No Typology Code available for payment source | Attending: Emergency Medicine | Admitting: Emergency Medicine

## 2018-08-29 ENCOUNTER — Emergency Department (HOSPITAL_COMMUNITY): Payer: No Typology Code available for payment source

## 2018-08-29 DIAGNOSIS — Y929 Unspecified place or not applicable: Secondary | ICD-10-CM | POA: Insufficient documentation

## 2018-08-29 DIAGNOSIS — Y9389 Activity, other specified: Secondary | ICD-10-CM | POA: Diagnosis not present

## 2018-08-29 DIAGNOSIS — Y999 Unspecified external cause status: Secondary | ICD-10-CM | POA: Diagnosis not present

## 2018-08-29 DIAGNOSIS — Z23 Encounter for immunization: Secondary | ICD-10-CM | POA: Insufficient documentation

## 2018-08-29 DIAGNOSIS — W230XXA Caught, crushed, jammed, or pinched between moving objects, initial encounter: Secondary | ICD-10-CM | POA: Diagnosis not present

## 2018-08-29 DIAGNOSIS — S51812A Laceration without foreign body of left forearm, initial encounter: Secondary | ICD-10-CM | POA: Diagnosis not present

## 2018-08-29 DIAGNOSIS — S5782XA Crushing injury of left forearm, initial encounter: Secondary | ICD-10-CM | POA: Diagnosis present

## 2018-08-29 MED ORDER — TETANUS-DIPHTH-ACELL PERTUSSIS 5-2.5-18.5 LF-MCG/0.5 IM SUSP
0.5000 mL | Freq: Once | INTRAMUSCULAR | Status: AC
  Administered 2018-08-29: 0.5 mL via INTRAMUSCULAR
  Filled 2018-08-29: qty 0.5

## 2018-08-29 MED ORDER — LIDOCAINE-EPINEPHRINE (PF) 2 %-1:200000 IJ SOLN
10.0000 mL | Freq: Once | INTRAMUSCULAR | Status: AC
  Administered 2018-08-29: 10 mL
  Filled 2018-08-29: qty 10

## 2018-08-29 NOTE — ED Notes (Signed)
Dressing non stick and 4x4's, kling and tape tolerated well

## 2018-08-29 NOTE — ED Provider Notes (Signed)
[facility] DEPT Provider Note   CSN: [id number] Arrival date & time: 08/29/18  1542    History   Chief Complaint Chief Complaint  Patient presents with  . Laceration    HPI Paul [name] is a 48 y.o. male.     The history is provided by the patient and medical records. No language interpreter was used.  Laceration  Paul [name] is a 48 y.o. male who presents to the Emergency Department complaining of laceration to the left forearm which occurred just prior to arrival.  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  He does have some pain to the left forearm area with movement of the wrist.  No numbness or weakness.  Unsure of his last tetanus vaccine.  History reviewed. No pertinent past medical history.  There are no active problems to display for this patient.   History reviewed. No pertinent surgical history.      Home Medications    Prior to Admission medications   Not on File    Family History Family History  Problem Relation Age of Onset  . Hypertension Mother     Social History Social History   Tobacco Use  . Smoking status: Never Smoker  . Smokeless tobacco: Never Used  Substance Use Topics  . Alcohol use: Yes    Comment: weekends only  . Drug use: No     Allergies   Patient has no known allergies.   Review of Systems Review of Systems  Musculoskeletal: Positive for myalgias.  Skin: Positive for wound.  Neurological: Negative for weakness and numbness.     Physical Exam Updated Vital Signs BP (!) 143/96 (BP Location: Left Arm)   Pulse 87   Temp 98.4 F (36.9 C) (Oral)   Resp 14   Ht 5\' 9"  (1.753 m)   Wt 81.6 kg   SpO2 100%   BMI 26.58 kg/m   Physical Exam Vitals signs and nursing note reviewed.  Constitutional:      General: He is not in acute distress.    Appearance: He is well-developed.  HENT:     Head: Normocephalic and  atraumatic.  Neck:     Musculoskeletal: Neck supple.  Cardiovascular:     Rate and Rhythm: Normal rate and regular rhythm.     Heart sounds: Normal heart sounds. No murmur.  Pulmonary:     Effort: Pulmonary effort is normal. No respiratory distress.     Breath sounds: Normal breath sounds. No wheezing or rales.  Musculoskeletal:     Comments: Full range of motion of the left upper extremity.  Good grip strength.  2+ radial pulse.  Sensation intact.  Skin:    General: Skin is warm and dry.     Comments: 3.5 cm laceration to left forearm.   Neurological:     Mental Status: He is alert.      ED Treatments / Results  Labs (all labs ordered are listed, but only abnormal results are displayed) Labs Reviewed - No data to display  EKG None  Radiology Dg Forearm Left  Result Date: 08/29/2018 CLINICAL DATA:  Soft tissue injury. EXAM: LEFT FOREARM - 2 VIEW COMPARISON:  None. FINDINGS: There is no evidence of fracture or other focal bone lesions. Soft tissue injury to the dorsum of the proximal forearm. IMPRESSION: No acute fracture or dislocation identified about the left forearm. Electronically Signed   By: Thomas Hoff  Dimitrova M.D.   On: 08/29/2018 18:36    Procedures .Marland Kitchen.Laceration Repair  Date/Time: 08/29/2018 7:22 PM Performed by: Ward, Chase PicketJaime Pilcher, PA-C Authorized by: Ward, Chase PicketJaime Pilcher, PA-C   Consent:    Consent obtained:  Verbal   Consent given by:  Patient   Risks discussed:  Pain, infection, poor cosmetic result and poor wound healing Anesthesia (see MAR for exact dosages):    Anesthesia method:  Local infiltration   Local anesthetic:  Lidocaine 2% w/o epi Laceration details:    Location:  Shoulder/arm   Shoulder/arm location:  L lower arm   Length (cm):  3.5 Repair type:    Repair type:  Simple Pre-procedure details:    Preparation:  Patient was prepped and draped in usual sterile fashion and imaging obtained to evaluate for foreign bodies Exploration:     Hemostasis achieved with:  Direct pressure   Wound exploration: wound explored through full range of motion and entire depth of wound probed and visualized   Treatment:    Area cleansed with:  Saline and Betadine   Amount of cleaning:  Standard   Irrigation solution:  Sterile saline Skin repair:    Repair method:  Sutures   Suture size:  4-0   Suture material:  Prolene   Suture technique:  Simple interrupted   Number of sutures:  7 Approximation:    Approximation:  Close Post-procedure details:    Patient tolerance of procedure:  Tolerated well, no immediate complications   (including critical care time)  Medications Ordered in ED Medications  lidocaine-EPINEPHrine (XYLOCAINE W/EPI) 2 %-1:200000 (PF) injection 10 mL (10 mLs Infiltration Given 08/29/18 1748)  Tdap (BOOSTRIX) injection 0.5 mL (0.5 mLs Intramuscular Given 08/29/18 1749)     Initial Impression / Assessment and Plan / ED Course  I have reviewed the triage vital signs and the nursing notes.  Pertinent labs & imaging results that were available during my care of the patient were reviewed by me and considered in my medical decision making (see chart for details).       Paul [name] is a 48 y.o. male who presents to ED for laceration of left forearm. Wound thoroughly cleaned in ED today. Wound explored and bottom of wound seen in a bloodless field. NVI with full ROM of the left upper extremity. X-ray negative. Tdap updated. Laceration repaired as dictated above. Patient counseled on home wound care. Follow up with PCP/urgent care or return to ER for suture removal in 7 days. Patient was urged to return to the Emergency Department for worsening pain, swelling, expanding erythema especially if it streaks away from the affected area, fever, or for any additional concerns. Patient verbalized understanding. All questions answered.  Patient seen by and discussed with Dr. Silverio LayYao who agrees with treatment plan.   Final Clinical  Impressions(s) / ED Diagnoses   Final diagnoses:  Laceration of left forearm, initial encounter    ED Discharge Orders    None       Ward, Chase PicketJaime Pilcher, PA-C 08/29/18 1925    Charlynne PanderYao, David Hsienta, MD 08/29/18 2312

## 2018-08-29 NOTE — ED Notes (Signed)
No reaction to medication noted alert and oriented x 3 call light in reach. 

## 2018-08-29 NOTE — ED Triage Notes (Signed)
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 Lac approx 2 inches. Unsure of last tetanus.

## 2018-08-29 NOTE — Discharge Instructions (Signed)
It was my pleasure taking care of you today!   Keep wound clean with mild soap and water. Keep area covered with a topical antibiotic ointment and bandage, keep bandage dry, and do not submerge in water for 24 hours. Ice and elevate for additional pain relief and swelling. Alternate between ibuprofen and Tylenol for additional pain relief. Follow up with your primary care doctor, [facility] or ER in approximately 7-10 days for wound recheck and suture removal. Monitor area for signs of infection to include, but not limited to: increasing pain, spreading redness, drainage/pus, worsening swelling, or fevers. Return to emergency department for emergent changing or worsening symptoms.

## 2018-11-24 ENCOUNTER — Other Ambulatory Visit: Payer: Self-pay

## 2018-11-24 ENCOUNTER — Encounter (HOSPITAL_COMMUNITY): Payer: Self-pay

## 2018-11-24 ENCOUNTER — Ambulatory Visit (HOSPITAL_COMMUNITY)
Admission: EM | Admit: 2018-11-24 | Discharge: 2018-11-24 | Disposition: A | Discharge status: Home / Self Care | Payer: BC Managed Care – PPO | Attending: Family Medicine | Admitting: Family Medicine

## 2018-11-24 DIAGNOSIS — M25512 Pain in left shoulder: Secondary | ICD-10-CM | POA: Diagnosis not present

## 2018-11-24 MED ORDER — METHYLPREDNISOLONE 4 MG PO TBPK: ORAL_TABLET | ORAL | 0 refills | Status: DC

## 2018-11-24 NOTE — ED Provider Notes (Signed)
Paul [name]    CSN: [id number] Arrival date & time: 11/24/18  1125      History   Chief Complaint Chief Complaint  Patient presents with  . Shoulder Pain    HPI Paul [name] is a 48 y.o. male.   HPI  Woke up with L shoulder pain today.  Denies any accident or injury.  Denies overuse.  States he does the same thing every day.  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  He has had left shoulder pain once before.  He was seen in the emergency room in July 2019.  X-rays were done at that time.  They were negative.  He does have a curved acromion which will place him at increased risk of rotator cuff disease.  No other findings.  He has no numbness or weakness into the hand, otherwise well  History reviewed. No pertinent past medical history.  There are no active problems to display for this patient.   History reviewed. No pertinent surgical history.     Home Medications    Prior to Admission medications   Medication Sig Start Date End Date Taking? Authorizing Provider  methylPREDNISolone (MEDROL DOSEPAK) 4 MG TBPK tablet tad 11/24/18   Raylene Everts, MD    Family History Family History  Problem Relation Age of Onset  . Hypertension Mother     Social History Social History   Tobacco Use  . Smoking status: Never Smoker  . Smokeless tobacco: Never Used  Substance Use Topics  . Alcohol use: Yes    Comment: weekends only  . Drug use: No     Allergies   Patient has no known allergies.   Review of Systems Review of Systems  Constitutional: Negative for chills and fever.  HENT: Negative for ear pain and sore throat.   Eyes: Negative for pain and visual disturbance.  Respiratory: Negative for cough and shortness of breath.   Cardiovascular: Negative for chest pain and palpitations.  Gastrointestinal: Negative for abdominal pain and vomiting.  Genitourinary: Negative for dysuria  and hematuria.  Musculoskeletal: Positive for arthralgias. Negative for back pain.  Skin: Negative for color change and rash.  Neurological: Negative for seizures and syncope.  All other systems reviewed and are negative.    Physical Exam Triage Vital Signs ED Triage Vitals  Enc Vitals Group     BP 11/24/18 1142 125/85     Pulse Rate 11/24/18 1142 69     Resp 11/24/18 1142 18     Temp 11/24/18 1142 98.6 F (37 C)     Temp Source 11/24/18 1142 Oral     SpO2 11/24/18 1142 96 %     Weight --      Height --      Head Circumference --      Peak Flow --      Pain Score 11/24/18 1143 8     Pain Loc --      Pain Edu? --      Excl. in [place]? --    No data found.  Updated Vital Signs BP 125/85 (BP Location: Right Arm)   Pulse 69   Temp 98.6 F (37 C) (Oral)   Resp 18   SpO2 96%       Physical Exam Constitutional:      General: He is not in acute distress.    Appearance: He is well-developed.  HENT:  Head: Normocephalic and atraumatic.  Eyes:     Conjunctiva/sclera: Conjunctivae normal.     Pupils: Pupils are equal, round, and reactive to light.  Neck:     Musculoskeletal: Normal range of motion.  Cardiovascular:     Rate and Rhythm: Normal rate.  Pulmonary:     Effort: Pulmonary effort is normal. No respiratory distress.  Abdominal:     General: There is no distension.     Palpations: Abdomen is soft.  Musculoskeletal: Normal range of motion.     Comments: Shoulder looks normal.  No palpable tenderness.  Can forward extend 30 degrees and abduct to 45.  Limited external over internal rotation  Skin:    General: Skin is warm and dry.  Neurological:     General: No focal deficit present.     Mental Status: He is alert.     Coordination: Coordination normal.     Deep Tendon Reflexes: Reflexes normal.  Psychiatric:        Mood and Affect: Mood normal.        Behavior: Behavior normal.      UC Treatments / Results  Labs (all labs ordered are listed, but  only abnormal results are displayed) Labs Reviewed - No data to display  EKG   Radiology No results found.  Procedures Procedures (including critical care time)  Medications Ordered in UC Medications - No data to display  Initial Impression / Assessment and Plan / UC Course  I have reviewed the triage vital signs and the nursing notes.  Pertinent labs & imaging results that were available during my care of the patient were reviewed by me and considered in my medical decision making (see chart for details).     Shoulder has limited range of motion consistent with a rotator cuff tendinitis/tendinopathy.  Gust exercise.  Ice.  Nonsteroidal anti-inflammatories.  Follow-up with PCP Final Clinical Impressions(s) / UC Diagnoses   Final diagnoses:  Acute pain of left shoulder     Discharge Instructions     Take the Medrol pack as prescribed.  This is a prednisone medicine will help with the pain and inflammation.  Take all of day 1 today, and follow schedule tomorrow. Put ice on area for 20 minutes every couple of hours Rest arm through the weekend Return to work on Monday.  If unable to resume work you may return here or call your primary care doctor   ED Prescriptions    Medication Sig Dispense Auth. Provider   methylPREDNISolone (MEDROL DOSEPAK) 4 MG TBPK tablet tad 21 tablet Eustace Moore, MD     PDMP not reviewed this encounter.   Eustace Moore, MD 11/24/18 [phone]

## 2018-11-24 NOTE — ED Triage Notes (Signed)
Pt presents with left shoulder pain that is not injury related.

## 2018-11-24 NOTE — Discharge Instructions (Addendum)
Take the Medrol pack as prescribed.  This is a prednisone medicine will help with the pain and inflammation.  Take all of day 1 today, and follow schedule tomorrow. Put ice on area for 20 minutes every couple of hours Rest arm through the weekend Return to work on Monday.  If unable to resume work you may return here or call your primary care doctor

## 2018-11-27 ENCOUNTER — Ambulatory Visit (HOSPITAL_COMMUNITY)
Admission: EM | Admit: 2018-11-27 | Discharge: 2018-11-27 | Disposition: A | Discharge status: Home / Self Care | Payer: BC Managed Care – PPO | Attending: Emergency Medicine | Admitting: Emergency Medicine

## 2018-11-27 ENCOUNTER — Encounter (HOSPITAL_COMMUNITY): Payer: Self-pay | Admitting: Emergency Medicine

## 2018-11-27 DIAGNOSIS — M79602 Pain in left arm: Secondary | ICD-10-CM | POA: Diagnosis not present

## 2018-11-27 MED ORDER — IBUPROFEN 600 MG PO TABS: 600.0000 mg | ORAL_TABLET | Freq: Four times a day (QID) | ORAL | 0 refills | Status: DC | PRN

## 2018-11-27 MED ORDER — TIZANIDINE HCL 4 MG PO TABS: 4.0000 mg | ORAL_TABLET | Freq: Three times a day (TID) | ORAL | 0 refills | Status: DC | PRN

## 2018-11-27 NOTE — ED Triage Notes (Signed)
Pt was seen here 10/30, was given steroid pack, the steroid pack is for 6 days, pt has taken it for two days total. Pt states he is still hurting. Was told to come back if still hurting.

## 2018-11-27 NOTE — Discharge Instructions (Addendum)
Take 600 mg of ibuprofen with 1 g of Tylenol 3-4 times a day as needed for pain.  Try the Zanaflex, which is a muscle relaxant.  Finish the steroids.  Try icing it for 20 minutes at a time after use and gentle stretching.  Follow-up with a primary care provider of your choice, see list below.  Below is a list of primary care practices who are taking new patients for you to follow-up with.  [name] at [facility] [address] [place], [place] [address] [phone]  [place], [place] [address] [phone]  [facility] [phone] [place] [address]  [facility]: [place]  [phone]  [facility]: [place]   [phone]  [name]: [address] [place]  [phone]  [facility] : [address] [place] [phone]  [facility]: [address] [place] [address] [phone]  [facility]: [place] [phone]  Dr. Blanchie Serve [place]  [phone]  Dr. Benito Mccreedy, [facility] [place], [place] [address]  [phone]  Go to www.goodrx.com to look up your medications. This will give you a list of where you can find your prescriptions at the most affordable prices. Or ask the pharmacist what the cash price is, or if they have any other discount programs available to help make your medication more affordable. This can be less expensive than what you would pay with insurance.

## 2018-11-27 NOTE — ED Provider Notes (Signed)
HPI  SUBJECTIVE:  Paul [name] is a right-handed 48 y.o. male who presents with persistent left axillary/upper arm pain after "sleeping on it wrong" starting 3 days ago.  He states that it radiates down the deltoid.  He denies trauma, fall.  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  No numbness or tingling, grip weakness, no bruising, swelling, erythema, fevers, rash, arm weakness.  He states this feels similar to right shoulder injury which was thought to be due to muscle spasm. Patient seen here 3 days ago for left shoulder pain.  Was thought to have a rotator cuff tendinitis/tendinopathy.  Advised exercise, ice, NSAIDs, and given Medrol Dosepak, follow-up with PCP.  Patient is only taking the Medrol Dosepak, he is not taking any NSAIDs, Tylenol.  There are no alleviating factors.  Symptoms are worse with abduction, forward flexion, extension.  Past Medical history negative for left shoulder injury, diabetes, hypertension.  PMD: None.   History reviewed. No pertinent past medical history.  History reviewed. No pertinent surgical history.  Family History  Problem Relation Age of Onset  . Hypertension Mother     Social History   Tobacco Use  . Smoking status: Never Smoker  . Smokeless tobacco: Never Used  Substance Use Topics  . Alcohol use: Yes    Comment: weekends only  . Drug use: No    No current facility-administered medications for this encounter.   Current Outpatient Medications:  .  ibuprofen (ADVIL) 600 MG tablet, Take 1 tablet (600 mg total) by mouth every 6 (six) hours as needed., Disp: 30 tablet, Rfl: 0 .  methylPREDNISolone (MEDROL DOSEPAK) 4 MG TBPK tablet, tad, Disp: 21 tablet, Rfl: 0 .  tiZANidine (ZANAFLEX) 4 MG tablet, Take 1 tablet (4 mg total) by mouth every 8 (eight) hours as needed for muscle spasms., Disp: 30 tablet, Rfl: 0  No Known Allergies   ROS  As noted in HPI.   Physical Exam  BP (!) 152/92   Pulse 71    Temp 98 F (36.7 C)   Resp 18   SpO2 98%   Constitutional: Well developed, well nourished, no acute distress Eyes:  EOMI, conjunctiva normal bilaterally HENT: Normocephalic, atraumatic,mucus membranes moist Respiratory: Normal inspiratory effort Cardiovascular: Normal rate GI: nondistended skin: No rash over the left shoulder, back, chest, axilla. skin intact Musculoskeletal: Left shoulder with ROM somewhat limited. Drop test painful but negative , clavicle NT , A/C joint NT, scapula NT  proximal humerus NT, shoulder joint NT , Motor strength normal. Sensation intact LT over deltoid region, distal NVI with hand on affected side having grossly intact sensation and strength in the distribution of the median, radial, and ulnar nerve. no pain with internal rotation,  no pain with external rotation,  negative tenderness in bicipital groove,  negative empty can test, unable to perform liftoff test, no instability with abduction/external rotation.  No tenderness along the pectoral, trapezius, latissimus muscles.  Positive tenderness medial left inner arm near the coracobrachialis.  Positive increased tone.  RP 2+. Neurologic: Alert & oriented x 3, no focal neuro deficits Psychiatric: Speech and behavior appropriate   ED Course   Medications - No data to display  No orders of the defined types were placed in this encounter.   No results found for this or any previous visit (from the past 24 hour(s)). No results found.  ED Clinical Impression  1. Left arm pain      ED  Assessment/Plan  Previous records reviewed.  As noted in HPI.  Patient has tenderness and increased tone over the coracobrachialis.  There is no rash, evidence of trauma.  He has no bony tenderness, no shoulder tenderness.  He is not taking any NSAIDs or Tylenol currently.  Sending him home with 600 mg ibuprofen combined with 1 g of Tylenol 3-4 times a day, having him continue Medrol Dosepak we will add Zanaflex.  Giving him  a primary care list for follow-up and for ongoing care.  Discussed MDM, treatment plan, and plan for follow-up with patient. patient agrees with plan.   Meds ordered this encounter  Medications  . ibuprofen (ADVIL) 600 MG tablet    Sig: Take 1 tablet (600 mg total) by mouth every 6 (six) hours as needed.    Dispense:  30 tablet    Refill:  0  . tiZANidine (ZANAFLEX) 4 MG tablet    Sig: Take 1 tablet (4 mg total) by mouth every 8 (eight) hours as needed for muscle spasms.    Dispense:  30 tablet    Refill:  0    *This clinic note was created using Scientist, clinical (histocompatibility and immunogenetics). Therefore, there may be occasional mistakes despite careful proofreading.   ?    Domenick Gong, MD 11/27/18 [phone]

## 2018-12-14 ENCOUNTER — Other Ambulatory Visit: Payer: Self-pay

## 2018-12-14 ENCOUNTER — Ambulatory Visit (HOSPITAL_COMMUNITY)
Admission: EM | Admit: 2018-12-14 | Discharge: 2018-12-14 | Disposition: A | Discharge status: Home / Self Care | Payer: BC Managed Care – PPO | Attending: Internal Medicine | Admitting: Internal Medicine

## 2018-12-14 ENCOUNTER — Encounter (HOSPITAL_COMMUNITY): Payer: Self-pay

## 2018-12-14 DIAGNOSIS — Z20822 Contact with and (suspected) exposure to covid-19: Secondary | ICD-10-CM

## 2018-12-14 DIAGNOSIS — Z20828 Contact with and (suspected) exposure to other viral communicable diseases: Secondary | ICD-10-CM | POA: Insufficient documentation

## 2018-12-14 NOTE — ED Triage Notes (Addendum)
Pt. Is here for testing required by his boss, a current employee tested [place] on Tuesday. The last contact he had with the POSITIVE employee was 11/16-4p. He denies any symptoms since.

## 2018-12-14 NOTE — Discharge Instructions (Signed)
Person Under Monitoring Name: Paul [name]  Location: [address] [place] [address]   Infection Prevention Recommendations for Individuals Confirmed to have, or Being Evaluated for, 2019 Novel Coronavirus (COVID-19) Infection Who Receive Care at Home  Individuals who are confirmed to have, or are being evaluated for, COVID-19 should follow the prevention steps below until a healthcare provider or local or state health department says they can return to normal activities.  Stay home except to get medical care You should restrict activities outside your home, except for getting medical care. Do not go to work, school, or public areas, and do not use public transportation or taxis.  Call ahead before visiting your doctor Before your medical appointment, call the healthcare provider and tell them that you have, or are being evaluated for, COVID-19 infection. This will help the healthcare providers office take steps to keep other people from getting infected. Ask your healthcare provider to call the local or state health department.  Monitor your symptoms Seek prompt medical attention if your illness is worsening (e.g., difficulty breathing). Before going to your medical appointment, call the healthcare provider and tell them that you have, or are being evaluated for, COVID-19 infection. Ask your healthcare provider to call the local or state health department.  Wear a facemask You should wear a facemask that covers your nose and mouth when you are in the same room with other people and when you visit a healthcare provider. People who live with or visit you should also wear a facemask while they are in the same room with you.  Separate yourself from other people in your home As much as possible, you should stay in a different room from other people in your home. Also, you should use a separate bathroom, if available.  Avoid sharing household items You should not  share dishes, drinking glasses, cups, eating utensils, towels, bedding, or other items with other people in your home. After using these items, you should wash them thoroughly with soap and water.  Cover your coughs and sneezes Cover your mouth and nose with a tissue when you cough or sneeze, or you can cough or sneeze into your sleeve. Throw used tissues in a lined trash can, and immediately wash your hands with soap and water for at least 20 seconds or use an alcohol-based hand rub.  Wash your [employer] your hands often and thoroughly with soap and water for at least 20 seconds. You can use an alcohol-based hand sanitizer if soap and water are not available and if your hands are not visibly dirty. Avoid touching your eyes, nose, and mouth with unwashed hands.   Prevention Steps for Caregivers and Household Members of Individuals Confirmed to have, or Being Evaluated for, COVID-19 Infection Being Cared for in the Home  If you live with, or provide care at home for, a person confirmed to have, or being evaluated for, COVID-19 infection please follow these guidelines to prevent infection:  Follow healthcare providers instructions Make sure that you understand and can help the patient follow any healthcare provider instructions for all care.  Provide for the patients basic needs You should help the patient with basic needs in the home and provide support for getting groceries, prescriptions, and other personal needs.  Monitor the patients symptoms If they are getting sicker, call his or her medical provider and tell them that the patient has, or is being evaluated for, COVID-19 infection. This will help the healthcare  providers office take steps to keep other people from getting infected. Ask the healthcare provider to call the local or state health department.  Limit the number of people who have contact with the patient If possible, have only one caregiver for the  patient. Other household members should stay in another home or place of residence. If this is not possible, they should stay in another room, or be separated from the patient as much as possible. Use a separate bathroom, if available. Restrict visitors who do not have an essential need to be in the home.  Keep older adults, very young children, and other sick people away from the patient Keep older adults, very young children, and those who have compromised immune systems or chronic health conditions away from the patient. This includes people with chronic heart, lung, or kidney conditions, diabetes, and cancer.  Ensure good ventilation Make sure that shared spaces in the home have good air flow, such as from an air conditioner or an opened window, weather permitting.  Wash your hands often Wash your hands often and thoroughly with soap and water for at least 20 seconds. You can use an alcohol based hand sanitizer if soap and water are not available and if your hands are not visibly dirty. Avoid touching your eyes, nose, and mouth with unwashed hands. Use disposable paper towels to dry your hands. If not available, use dedicated cloth towels and replace them when they become wet.  Wear a facemask and gloves Wear a disposable facemask at all times in the room and gloves when you touch or have contact with the patients blood, body fluids, and/or secretions or excretions, such as sweat, saliva, sputum, nasal mucus, vomit, urine, or feces.  Ensure the mask fits over your nose and mouth tightly, and do not touch it during use. Throw out disposable facemasks and gloves after using them. Do not reuse. Wash your hands immediately after removing your facemask and gloves. If your personal clothing becomes contaminated, carefully remove clothing and launder. Wash your hands after handling contaminated clothing. Place all used disposable facemasks, gloves, and other waste in a lined container before  disposing them with other household waste. Remove gloves and wash your hands immediately after handling these items.  Do not share dishes, glasses, or other household items with the patient Avoid sharing household items. You should not share dishes, drinking glasses, cups, eating utensils, towels, bedding, or other items with a patient who is confirmed to have, or being evaluated for, COVID-19 infection. After the person uses these items, you should wash them thoroughly with soap and water.  Wash laundry thoroughly Immediately remove and wash clothes or bedding that have blood, body fluids, and/or secretions or excretions, such as sweat, saliva, sputum, nasal mucus, vomit, urine, or feces, on them. Wear gloves when handling laundry from the patient. Read and follow directions on labels of laundry or clothing items and detergent. In general, wash and dry with the warmest temperatures recommended on the label.  Clean all areas the individual has used often Clean all touchable surfaces, such as counters, tabletops, doorknobs, bathroom fixtures, toilets, phones, keyboards, tablets, and bedside tables, every day. Also, clean any surfaces that may have blood, body fluids, and/or secretions or excretions on them. Wear gloves when cleaning surfaces the patient has come in contact with. Use a diluted bleach solution (e.g., dilute bleach with 1 part bleach and 10 parts water) or a household disinfectant with a label that says EPA-registered for coronaviruses. To make  a bleach solution at home, add 1 tablespoon of bleach to 1 quart (4 cups) of water. For a larger supply, add  cup of bleach to 1 gallon (16 cups) of water. Read labels of cleaning products and follow recommendations provided on product labels. Labels contain instructions for safe and effective use of the cleaning product including precautions you should take when applying the product, such as wearing gloves or eye protection and making sure you  have good ventilation during use of the product. Remove gloves and wash hands immediately after cleaning.  Monitor yourself for signs and symptoms of illness Caregivers and household members are considered close contacts, should monitor their health, and will be asked to limit movement outside of the home to the extent possible. Follow the monitoring steps for close contacts listed on the symptom monitoring form.   ? If you have additional questions, contact your local health department or call the epidemiologist on call at [phone] (available 24/7). ? This guidance is subject to change. For the most up-to-date guidance from [facility], please refer to their website: YouBlogs.pl

## 2018-12-14 NOTE — ED Provider Notes (Signed)
[facility]    CSN: [id number] Arrival date & time: 12/14/18  1434      History   Chief Complaint No chief complaint on file. Covid exposure  HPI Pecolia AdesLennox M Stthomas is a 48 y.o. male no significant past medical history presenting today for Covid testing.  Patient states that he had exposure to a positive Covid at work on Tuesday.  Workers requiring negative Covid test before returning.  He has not had any symptoms.  Denies cough, congestion, sore throat.  Denies fevers chills or body aches.  Denies nausea vomiting or diarrhea.  Normal oral intake, normal energy level.  HPI  History reviewed. No pertinent past medical history.  There are no active problems to display for this patient.   History reviewed. No pertinent surgical history.     Home Medications    Prior to Admission medications   Medication Sig Start Date End Date Taking? Authorizing Provider  ibuprofen (ADVIL) 600 MG tablet Take 1 tablet (600 mg total) by mouth every 6 (six) hours as needed. 11/27/18   Domenick GongMortenson, Ashley, MD  methylPREDNISolone (MEDROL DOSEPAK) 4 MG TBPK tablet tad 11/24/18   Eustace MooreNelson, Yvonne Sue, MD  tiZANidine (ZANAFLEX) 4 MG tablet Take 1 tablet (4 mg total) by mouth every 8 (eight) hours as needed for muscle spasms. 11/27/18   Domenick GongMortenson, Ashley, MD    Family History Family History  Problem Relation Age of Onset   Hypertension Mother    Prostate cancer Father     Social History Social History   Tobacco Use   Smoking status: Never Smoker   Smokeless tobacco: Never Used  Substance Use Topics   Alcohol use: Yes    Comment: weekends only   Drug use: No     Allergies   Patient has no known allergies.   Review of Systems Review of Systems  Constitutional: Negative for activity change, appetite change, chills, fatigue and fever.  HENT: Negative for congestion, ear pain, rhinorrhea, sinus pressure, sore throat and trouble swallowing.   Eyes: Negative for discharge and  redness.  Respiratory: Negative for cough, chest tightness and shortness of breath.   Cardiovascular: Negative for chest pain.  Gastrointestinal: Negative for abdominal pain, diarrhea, nausea and vomiting.  Musculoskeletal: Negative for myalgias.  Skin: Negative for rash.  Neurological: Negative for dizziness, light-headedness and headaches.     Physical Exam Triage Vital Signs ED Triage Vitals  Enc Vitals Group     BP 12/14/18 1525 (!) 147/98     Pulse Rate 12/14/18 1525 65     Resp 12/14/18 1525 18     Temp 12/14/18 1525 98.5 F (36.9 C)     Temp Source 12/14/18 1525 Oral     SpO2 12/14/18 1525 98 %     Weight 12/14/18 1520 182 lb (82.6 kg)     Height --      Head Circumference --      Peak Flow --      Pain Score 12/14/18 1520 0     Pain Loc --      Pain Edu? --      Excl. in GC? --    No data found.  Updated Vital Signs BP (!) 147/98 (BP Location: Right Arm)    Pulse 65    Temp 98.5 F (36.9 C) (Oral)    Resp 18    Wt 182 lb (82.6 kg)    SpO2 98%    BMI 26.88 kg/m   Visual Acuity Right Eye  Distance:   Left Eye Distance:   Bilateral Distance:    Right Eye Near:   Left Eye Near:    Bilateral Near:     Physical Exam Vitals signs and nursing note reviewed.  Constitutional:      Appearance: He is well-developed.     Comments: No acute distress  HENT:     Head: Normocephalic and atraumatic.     Nose: Nose normal.  Eyes:     Conjunctiva/sclera: Conjunctivae normal.  Neck:     Musculoskeletal: Neck supple.  Cardiovascular:     Rate and Rhythm: Normal rate.  Pulmonary:     Effort: Pulmonary effort is normal. No respiratory distress.     Comments: Breathing comfortably at rest, CTABL, no wheezing, rales or other adventitious sounds auscultated Abdominal:     General: There is no distension.  Musculoskeletal: Normal range of motion.  Skin:    General: Skin is warm and dry.  Neurological:     Mental Status: He is alert and oriented to person, place, and  time.      UC Treatments / Results  Labs (all labs ordered are listed, but only abnormal results are displayed) Labs Reviewed  SARS CORONAVIRUS 2 (TAT 6-24 HRS)    EKG   Radiology No results found.  Procedures Procedures (including critical care time)  Medications Ordered in UC Medications - No data to display  Initial Impression / Assessment and Plan / UC Course  I have reviewed the triage vital signs and the nursing notes.  Pertinent labs & imaging results that were available during my care of the patient were reviewed by me and considered in my medical decision making (see chart for details).     Covid swab pending.  Currently asymptomatic.  Will have quarantine until results return.  Monitor for development of any symptoms.Discussed strict return precautions. Patient verbalized understanding and is agreeable with plan.  Final Clinical Impressions(s) / UC Diagnoses   Final diagnoses:  Exposure to COVID-19 virus     Discharge Instructions        Person Under Monitoring Name: OLSEN MCCUTCHAN  Location: [place] [address]   Infection Prevention Recommendations for Individuals Confirmed to have, or Being Evaluated for, 2019 Novel Coronavirus (COVID-19) Infection Who Receive Care at Home  Individuals who are confirmed to have, or are being evaluated for, COVID-19 should follow the prevention steps below until a healthcare provider or local or state health department says they can return to normal activities.  Stay home except to get medical care You should restrict activities outside your home, except for getting medical care. Do not go to work, school, or public areas, and do not use public transportation or taxis.  Call ahead before visiting your doctor Before your medical appointment, call the healthcare provider and tell them that you have, or are being evaluated for, COVID-19 infection. This will help the healthcare providers  office take steps to keep other people from getting infected. Ask your healthcare provider to call the local or state health department.  Monitor your symptoms Seek prompt medical attention if your illness is worsening (e.g., difficulty breathing). Before going to your medical appointment, call the healthcare provider and tell them that you have, or are being evaluated for, COVID-19 infection. Ask your healthcare provider to call the local or state health department.  Wear a facemask You should wear a facemask that covers your nose and mouth when you are in the same room  with other people and when you visit a healthcare provider. People who live with or visit you should also wear a facemask while they are in the same room with you.  Separate yourself from other people in your home As much as possible, you should stay in a different room from other people in your home. Also, you should use a separate bathroom, if available.  Avoid sharing household items You should not share dishes, drinking glasses, cups, eating utensils, towels, bedding, or other items with other people in your home. After using these items, you should wash them thoroughly with soap and water.  Cover your coughs and sneezes Cover your mouth and nose with a tissue when you cough or sneeze, or you can cough or sneeze into your sleeve. Throw used tissues in a lined trash can, and immediately wash your hands with soap and water for at least 20 seconds or use an alcohol-based hand rub.  Wash your [employer] your hands often and thoroughly with soap and water for at least 20 seconds. You can use an alcohol-based hand sanitizer if soap and water are not available and if your hands are not visibly dirty. Avoid touching your eyes, nose, and mouth with unwashed hands.   Prevention Steps for Caregivers and Household Members of Individuals Confirmed to have, or Being Evaluated for, COVID-19 Infection Being Cared for in the  Home  If you live with, or provide care at home for, a person confirmed to have, or being evaluated for, COVID-19 infection please follow these guidelines to prevent infection:  Follow healthcare providers instructions Make sure that you understand and can help the patient follow any healthcare provider instructions for all care.  Provide for the patients basic needs You should help the patient with basic needs in the home and provide support for getting groceries, prescriptions, and other personal needs.  Monitor the patients symptoms If they are getting sicker, call his or her medical provider and tell them that the patient has, or is being evaluated for, COVID-19 infection. This will help the healthcare providers office take steps to keep other people from getting infected. Ask the healthcare provider to call the local or state health department.  Limit the number of people who have contact with the patient  If possible, have only one caregiver for the patient.  Other household members should stay in another home or place of residence. If this is not possible, they should stay  in another room, or be separated from the patient as much as possible. Use a separate bathroom, if available.  Restrict visitors who do not have an essential need to be in the home.  Keep older adults, very young children, and other sick people away from the patient Keep older adults, very young children, and those who have compromised immune systems or chronic health conditions away from the patient. This includes people with chronic heart, lung, or kidney conditions, diabetes, and cancer.  Ensure good ventilation Make sure that shared spaces in the home have good air flow, such as from an air conditioner or an opened window, weather permitting.  Wash your hands often  Wash your hands often and thoroughly with soap and water for at least 20 seconds. You can use an alcohol based hand sanitizer if  soap and water are not available and if your hands are not visibly dirty.  Avoid touching your eyes, nose, and mouth with unwashed hands.  Use disposable paper towels to dry your hands. If not  available, use dedicated cloth towels and replace them when they become wet.  Wear a facemask and gloves  Wear a disposable facemask at all times in the room and gloves when you touch or have contact with the patients blood, body fluids, and/or secretions or excretions, such as sweat, saliva, sputum, nasal mucus, vomit, urine, or feces.  Ensure the mask fits over your nose and mouth tightly, and do not touch it during use.  Throw out disposable facemasks and gloves after using them. Do not reuse.  Wash your hands immediately after removing your facemask and gloves.  If your personal clothing becomes contaminated, carefully remove clothing and launder. Wash your hands after handling contaminated clothing.  Place all used disposable facemasks, gloves, and other waste in a lined container before disposing them with other household waste.  Remove gloves and wash your hands immediately after handling these items.  Do not share dishes, glasses, or other household items with the patient  Avoid sharing household items. You should not share dishes, drinking glasses, cups, eating utensils, towels, bedding, or other items with a patient who is confirmed to have, or being evaluated for, COVID-19 infection.  After the person uses these items, you should wash them thoroughly with soap and water.  Wash laundry thoroughly  Immediately remove and wash clothes or bedding that have blood, body fluids, and/or secretions or excretions, such as sweat, saliva, sputum, nasal mucus, vomit, urine, or feces, on them.  Wear gloves when handling laundry from the patient.  Read and follow directions on labels of laundry or clothing items and detergent. In general, wash and dry with the warmest temperatures recommended on  the label.  Clean all areas the individual has used often  Clean all touchable surfaces, such as counters, tabletops, doorknobs, bathroom fixtures, toilets, phones, keyboards, tablets, and bedside tables, every day. Also, clean any surfaces that may have blood, body fluids, and/or secretions or excretions on them.  Wear gloves when cleaning surfaces the patient has come in contact with.  Use a diluted bleach solution (e.g., dilute bleach with 1 part bleach and 10 parts water) or a household disinfectant with a label that says EPA-registered for coronaviruses. To make a bleach solution at home, add 1 tablespoon of bleach to 1 quart (4 cups) of water. For a larger supply, add  cup of bleach to 1 gallon (16 cups) of water.  Read labels of cleaning products and follow recommendations provided on product labels. Labels contain instructions for safe and effective use of the cleaning product including precautions you should take when applying the product, such as wearing gloves or eye protection and making sure you have good ventilation during use of the product.  Remove gloves and wash hands immediately after cleaning.  Monitor yourself for signs and symptoms of illness Caregivers and household members are considered close contacts, should monitor their health, and will be asked to limit movement outside of the home to the extent possible. Follow the monitoring steps for close contacts listed on the symptom monitoring form.   ? If you have additional questions, contact your local health department or call the epidemiologist on call at [phone] (available 24/7). ? This guidance is subject to change. For the most up-to-date guidance from [facility], please refer to their website: [email or web address]   ED Prescriptions    None     PDMP not reviewed this encounter.   Lew Dawes, [place] 12/14/18 [phone]

## 2018-12-16 LAB — NOVEL CORONAVIRUS, NAA (HOSP ORDER, SEND-OUT TO REF LAB; TAT 18-24 HRS): SARS-CoV-2, NAA: NOT DETECTED

## 2019-02-19 ENCOUNTER — Encounter (HOSPITAL_COMMUNITY): Payer: Self-pay | Admitting: Emergency Medicine

## 2019-02-19 ENCOUNTER — Ambulatory Visit (HOSPITAL_COMMUNITY)
Admission: EM | Admit: 2019-02-19 | Discharge: 2019-02-19 | Disposition: A | Discharge status: Home / Self Care | Payer: BC Managed Care – PPO | Attending: Family Medicine | Admitting: Family Medicine

## 2019-02-19 ENCOUNTER — Other Ambulatory Visit: Payer: Self-pay

## 2019-02-19 DIAGNOSIS — R040 Epistaxis: Secondary | ICD-10-CM

## 2019-02-19 MED ORDER — AFRIN NASAL SPRAY 0.05 % NA SOLN: 1.0000 | Freq: Two times a day (BID) | NASAL | 0 refills | Status: DC

## 2019-02-19 MED ORDER — SALINE SPRAY 0.65 % NA SOLN: 1.0000 | NASAL | 0 refills | Status: DC | PRN

## 2019-02-19 MED ORDER — METHYLPREDNISOLONE SODIUM SUCC 125 MG IJ SOLR: 80.0000 mg | Freq: Once | INTRAMUSCULAR | Status: DC

## 2019-02-19 MED ORDER — DIPHENHYDRAMINE HCL 25 MG PO CAPS: 25.0000 mg | ORAL_CAPSULE | Freq: Once | ORAL | Status: DC

## 2019-02-19 MED ORDER — FLUTICASONE PROPIONATE 50 MCG/ACT NA SUSP: 1.0000 | Freq: Every day | NASAL | 2 refills | Status: DC

## 2019-02-19 NOTE — ED Triage Notes (Signed)
Patient reports a nosebleed last week and one this week.  Denies any uri symptoms.  Last nosebleed was today.  No bleeding at the momment

## 2019-02-19 NOTE — Discharge Instructions (Signed)
Use the afrin as needed for nose bleeding.  Do not use more than 3 days because this can cause rebound congestion and  make the situation worse.  Saline nasal spray to moisten the nasal passages.  This problem may be due to allergies or nasal dryness.  If this problem continues you will need to see ENT specialist.

## 2019-02-20 NOTE — ED Provider Notes (Signed)
[facility]    CSN: [id number] Arrival date & time: 02/19/19  1352      History   Chief Complaint Chief Complaint  Patient presents with  . Epistaxis    HPI Paul [name] is [name] 50 y.o. male.   Patient is [name] 49 year old male with no significant past medical history.  He presents today with nosebleeding.  He has had 2 episodes in the past 2 weeks.  Episodes each lasting about 15 to 20 minutes.  Was able to stop the bleeding by holding pressure.  Denies any blood thinner use.  Denies any nasal congestion, rhinorrhea, sinus pressure, ear pain, sore throat, fevers.  No history of allergies.  No dizziness, headache, lightheadedness, blurred vision, trauma.  History of elevated blood pressure readings but does not take blood pressure medication.   ROS per HPI      History reviewed. No pertinent past medical history.  There are no problems to display for this patient.   History reviewed. No pertinent surgical history.     Home Medications    Prior to Admission medications   Medication Sig Start Date End Date Taking? Authorizing Provider  ibuprofen (ADVIL) 600 MG tablet Take 1 tablet (600 mg total) by mouth every 6 (six) hours as needed. 11/27/18   Domenick Gong, MD  oxymetazoline (AFRIN NASAL SPRAY) 0.05 % nasal spray Place 1 spray into both nostrils 2 (two) times daily. 02/19/19   Paul Byes A, NP  sodium chloride (OCEAN) 0.65 % SOLN nasal spray Place 1 spray into both nostrils as needed for congestion. 02/19/19   Paul [name], Paul Manchester A, NP  fluticasone (FLONASE) 50 MCG/ACT nasal spray Place 1 spray into both nostrils daily. 02/19/19 02/19/19  Paul Aris, NP    Family History Family History  Problem Relation Age of Onset  . Hypertension Mother   . Prostate cancer Father     Social History Social History   Tobacco Use  . Smoking status: Never Smoker  . Smokeless tobacco: Never Used  Substance Use Topics  . Alcohol use: Yes    Comment: weekends only  . Drug  use: No     Allergies   Patient has no known allergies.   Review of Systems Review of Systems   Physical Exam Triage Vital Signs ED Triage Vitals  Enc Vitals Group     BP 02/19/19 1412 (!) 148/98     Pulse Rate 02/19/19 1412 85     Resp 02/19/19 1412 (!) 22     Temp 02/19/19 1412 98.8 F (37.1 C)     Temp Source 02/19/19 1412 Oral     SpO2 02/19/19 1412 97 %     Weight --      Height --      Head Circumference --      Peak Flow --      Pain Score 02/19/19 1518 0     Pain Loc --      Pain Edu? --      Excl. in GC? --    No data found.  Updated Vital Signs BP (!) 148/98 (BP Location: Right Arm)   Pulse 85   Temp 98.8 F (37.1 C) (Oral)   Resp (!) 22   SpO2 97%   Visual Acuity Right Eye Distance:   Left Eye Distance:   Bilateral Distance:    Right Eye Near:   Left Eye Near:    Bilateral Near:     Physical Exam Vitals and nursing note  reviewed.  Constitutional:      Appearance: Normal appearance.  HENT:     Head: Normocephalic and atraumatic.     Nose: Nose normal.     Comments: Bilateral nasal turbinate swelling with dry crusted blood inside nares. Nontender to palpation of entire nose No sinus tenderness    Mouth/Throat:     Pharynx: Oropharynx is clear.  Eyes:     Conjunctiva/sclera: Conjunctivae normal.  Pulmonary:     Effort: Pulmonary effort is normal.  Musculoskeletal:        General: Normal range of motion.     Cervical back: Normal range of motion.  Skin:    General: Skin is warm and dry.  Neurological:     Mental Status: He is alert.  Psychiatric:        Mood and Affect: Mood normal.      UC Treatments / Results  Labs (all labs ordered are listed, but only abnormal results are displayed) Labs Reviewed - No data to display  EKG   Radiology No results found.  Procedures Procedures (including critical care time)  Medications Ordered in UC Medications - No data to display  Initial Impression / Assessment and Plan / UC  Course  I have reviewed the triage vital signs and the nursing notes.  Pertinent labs & imaging results that were available during my care of the patient were reviewed by me and considered in my medical decision making (see chart for details).     Epistaxis-patient has had 1 episode weekly for the last 2 weeks that were controlled with holding pressure. He is not on blood thinners.  Most likely symptoms are allergy or nasal dryness related. We will have him do saline nasal spray to moisten nasal patches and recommended humidifier. He may use Afrin as needed for nosebleeding but precautions given Recommended if symptoms continue or worsen he will need to follow-up with ear nose and throat specialist. Final Clinical Impressions(s) / UC Diagnoses   Final diagnoses:  Epistaxis     Discharge Instructions     Use the afrin as needed for nose bleeding.  Do not use more than 3 days because this can cause rebound congestion and  make the situation worse.  Saline nasal spray to moisten the nasal passages.  This problem may be due to allergies or nasal dryness.  If this problem continues you will need to see ENT specialist.     ED Prescriptions    Medication Sig Dispense Auth. Provider   sodium chloride (OCEAN) 0.65 % SOLN nasal spray Place 1 spray into both nostrils as needed for congestion. 30 mL Paul [name] A, NP   fluticasone (FLONASE) 50 MCG/ACT nasal spray  (Status: Discontinued) Place 1 spray into both nostrils daily. 16 g Paul [name] A, NP   oxymetazoline (AFRIN NASAL SPRAY) 0.05 % nasal spray Place 1 spray into both nostrils 2 (two) times daily. 30 mL Paul Mccard A, NP     PDMP not reviewed this encounter.   Orvan July, NP 02/20/19 [phone]

## 2019-09-06 ENCOUNTER — Emergency Department (HOSPITAL_COMMUNITY)
Admission: EM | Admit: 2019-09-06 | Discharge: 2019-09-06 | Disposition: A | Discharge status: Home / Self Care | Payer: BC Managed Care – PPO | Attending: Emergency Medicine | Admitting: Emergency Medicine

## 2019-09-06 ENCOUNTER — Other Ambulatory Visit: Payer: Self-pay

## 2019-09-06 ENCOUNTER — Encounter (HOSPITAL_COMMUNITY): Payer: Self-pay | Admitting: Obstetrics and Gynecology

## 2019-09-06 DIAGNOSIS — R369 Urethral discharge, unspecified: Secondary | ICD-10-CM

## 2019-09-06 DIAGNOSIS — Z202 Contact with and (suspected) exposure to infections with a predominantly sexual mode of transmission: Secondary | ICD-10-CM | POA: Insufficient documentation

## 2019-09-06 DIAGNOSIS — R300 Dysuria: Secondary | ICD-10-CM | POA: Diagnosis not present

## 2019-09-06 LAB — URINALYSIS, ROUTINE W REFLEX MICROSCOPIC
Bilirubin Urine: NEGATIVE
Glucose, UA: NEGATIVE mg/dL
Ketones, ur: NEGATIVE mg/dL
Nitrite: NEGATIVE
Protein, ur: 30 mg/dL — AB
Specific Gravity, Urine: 1.029 (ref 1.005–1.030)
WBC, UA: >50 WBC/hpf — ABNORMAL HIGH (ref 0–5)
pH: 5 (ref 5.0–8.0)

## 2019-09-06 LAB — HIV ANTIBODY (ROUTINE TESTING W REFLEX): HIV Screen 4th Generation wRfx: NONREACTIVE

## 2019-09-06 MED ORDER — CEFTRIAXONE SODIUM 1 G IJ SOLR
500.0000 mg | Freq: Once | INTRAMUSCULAR | Status: AC
  Administered 2019-09-06: 500 mg via INTRAMUSCULAR
  Filled 2019-09-06: qty 10

## 2019-09-06 MED ORDER — LIDOCAINE HCL (PF) 1 % IJ SOLN
1.0000 mL | Freq: Once | INTRAMUSCULAR | Status: DC
  Filled 2019-09-06: qty 30

## 2019-09-06 MED ORDER — DOXYCYCLINE HYCLATE 100 MG PO CAPS: 100.0000 mg | ORAL_CAPSULE | Freq: Two times a day (BID) | ORAL | 0 refills | Status: AC

## 2019-09-06 MED ORDER — STERILE WATER FOR INJECTION IJ SOLN
INTRAMUSCULAR | Status: AC
  Administered 2019-09-06: 10 mL
  Filled 2019-09-06: qty 10

## 2019-09-06 NOTE — Discharge Instructions (Addendum)
You have been seen here for STD exposure.  I prescribed you antibiotics please take as prescribed.  This antibiotic can make you more susceptible to sunburn please wear sunscreen and wear something to cover your face.  You been treated for gonorrhea and chlamydia but your HIV and syphilis tests are pending if positive the hospital contact you for further instruction.  I need you to abstain from sexual intercourse for the next 2 weeks.  If symptoms persist please contact the [facility] department as they provide screening and treatment for individuals with STDs.  I want you to come back to the emergency department if you develop severe fever, shortness of breath, chest pain, uncontrolled nausea, vomiting, diarrhea as these symptoms require further evaluation management.

## 2019-09-06 NOTE — ED Triage Notes (Signed)
Patient reports he needs to be tested for STD. Patient reports burning with urination and whit drainage from penis

## 2019-09-06 NOTE — ED Provider Notes (Signed)
[facility]-EMERGENCY DEPT Provider Note   CSN: [id number] Arrival date & time: 09/06/19  1645     History Chief Complaint  Patient presents with  . Exposure to STD    Paul [name] is a 49 y.o. male.  HPI   Patient presents to the emergency department with chief complaint of penile discharge, burning sensation when he pees and concern of STD exposure.  Patient explains last Saturday he had unprotected sex and since then he has had a white discharge coming from his penis.  He also admits dysuria but denies testicular pain, abdominal pain, lower back pain, nausea, vomiting, fever.  Patient has had STDs in the past and states this feels like he has another one.  He denies any alleviating or aggravating factors at this time.  He has no known medical history is, does not take any medication on daily basis.  He denies headache, fever, chills, shortness of breath, chest pain, abdominal pain, diarrhea, pedal edema.  No past medical history on file.  There are no problems to display for this patient.   No past surgical history on file.     Family History  Problem Relation Age of Onset  . Hypertension Mother   . Prostate cancer Father     Social History   Tobacco Use  . Smoking status: Never Smoker  . Smokeless tobacco: Never Used  Substance Use Topics  . Alcohol use: Yes    Comment: weekends only  . Drug use: No    Home Medications Prior to Admission medications   Medication Sig Start Date End Date Taking? Authorizing Provider  doxycycline (VIBRAMYCIN) 100 MG capsule Take 1 capsule (100 mg total) by mouth 2 (two) times daily for 7 days. 09/06/19 09/13/19  Carroll Sage, PA-C  ibuprofen (ADVIL) 600 MG tablet Take 1 tablet (600 mg total) by mouth every 6 (six) hours as needed. 11/27/18   Domenick Gong, MD  oxymetazoline (AFRIN NASAL SPRAY) 0.05 % nasal spray Place 1 spray into both nostrils 2 (two) times daily. 02/19/19   Dahlia Byes A, NP  sodium  chloride (OCEAN) 0.65 % SOLN nasal spray Place 1 spray into both nostrils as needed for congestion. 02/19/19   Bast, Gloris Manchester A, NP  fluticasone (FLONASE) 50 MCG/ACT nasal spray Place 1 spray into both nostrils daily. 02/19/19 02/19/19  Janace Aris, NP    Allergies    Patient has no known allergies.  Review of Systems   Review of Systems  Constitutional: Negative for chills and fever.  HENT: Negative for congestion.   Respiratory: Negative for shortness of breath.   Cardiovascular: Negative for chest pain.  Gastrointestinal: Negative for abdominal pain, diarrhea, nausea and vomiting.  Genitourinary: Positive for discharge and dysuria. Negative for enuresis, flank pain, frequency, hematuria, penile pain, penile swelling, scrotal swelling and testicular pain.  Musculoskeletal: Negative for back pain.  Skin: Negative for rash.  Neurological: Negative for dizziness and headaches.  Hematological: Does not bruise/bleed easily.    Physical Exam Updated Vital Signs BP (!) 144/110 (BP Location: Left Arm)   Pulse 71   Temp 98.1 F (36.7 C) (Oral)   Resp 17   Ht 5\' 9"  (1.753 m)   Wt 79.4 kg   SpO2 98%   BMI 25.84 kg/m   Physical Exam Vitals and nursing note reviewed. Exam conducted with a chaperone present.  Constitutional:      General: He is not in acute distress.    Appearance: He is not  ill-appearing.  HENT:     Head: Normocephalic and atraumatic.     Nose: No congestion.     Mouth/Throat:     Mouth: Mucous membranes are moist.     Pharynx: Oropharynx is clear.  Eyes:     General: No scleral icterus. Cardiovascular:     Rate and Rhythm: Normal rate and regular rhythm.     Pulses: Normal pulses.     Heart sounds: No murmur heard.  No friction rub. No gallop.   Pulmonary:     Effort: No respiratory distress.     Breath sounds: No wheezing, rhonchi or rales.  Abdominal:     General: There is no distension.     Tenderness: There is no abdominal tenderness. There is no  guarding.  Genitourinary:    Penis: Normal.      Testes: Normal.     Comments: Genital exam was performed, no gross abnormalities noted.  Testicles were palpated nontender to palpation, white discharge was noted on exam coming from patient's penis. Musculoskeletal:        General: No swelling.  Skin:    General: Skin is warm and dry.     Capillary Refill: Capillary refill takes less than 2 seconds.     Findings: No rash.  Neurological:     Mental Status: He is alert and oriented to person, place, and time.  Psychiatric:        Mood and Affect: Mood normal.     ED Results / Procedures / Treatments   Labs (all labs ordered are listed, but only abnormal results are displayed) Labs Reviewed  URINALYSIS, ROUTINE W REFLEX MICROSCOPIC - Abnormal; Notable for the following components:      Result Value   APPearance TURBID (*)    Hgb urine dipstick MODERATE (*)    Protein, ur 30 (*)    Leukocytes,Ua MODERATE (*)    WBC, UA >50 (*)    Bacteria, UA RARE (*)    All other components within normal limits  URINE CULTURE  RPR  HIV ANTIBODY (ROUTINE TESTING W REFLEX)  GC/CHLAMYDIA PROBE AMP ([place]) NOT AT [facility]    EKG None  Radiology No results found.  Procedures Procedures (including critical care time)  Medications Ordered in ED Medications  cefTRIAXone (ROCEPHIN) injection 500 mg (has no administration in time range)  lidocaine (PF) (XYLOCAINE) 1 % injection 1 mL (has no administration in time range)    ED Course  I have reviewed the triage vital signs and the nursing notes.  Pertinent labs & imaging results that were available during my care of the patient were reviewed by me and considered in my medical decision making (see chart for details).    MDM Rules/Calculators/A&P                          I have personally reviewed all imaging, labs and have interpreted them.  Patient was alert and oriented did not appear to be in any acute distress.  On exam patient  had visible white discharge coming from his penis, testicles were nontender to palpation, abdominal exam was benign nontender to palpation.  No CVA tenderness.  Will order UA, gonorrhea, chlamydia, syphilis and HIV testing.  Patient will be treated prophylactically for gonorrhea and chlamydia.   UA shows moderate leukocytes, red blood cells, white blood cells and rare bacteria.  I have low suspicion patient is suffering from a UTI most likely this is secondary  to STD.  Will send urine for culture.  Low suspicion for systemic infection as patient is nontoxic-appearing, vital signs within normal limits, no obvious sign of infection seen on exam.  Low suspicion for pyelonephritis or UTI as has no CVA tenderness, has no abdominal pain, not complaining of nausea or vomiting.  Low suspicion for epididymitis as patient denies testicular pain, testicles were palpated nontender to palpation.  Due to well-appearing patient further lab work and imaging were not warranted.  Patient appears to be resting comfortably in bed show no acute signs stress.  Vital signs remained stable does not meet criteria to be admitted to the hospital.  Patient suffered STD exposure treat him prophylactically for gonorrhea and chlamydia.  Given a prescription for doxycycline to cover him for chlamydia.  Sent urine for culture.  Patient instructed to abstain from sexual intercourse for the next 2 weeks.  Patient discussed with attending agrees assessment plan.  Patient was given at home care and strict return precautions.  Patient verbalized that he understood and agreed with said plan.  Final Clinical Impression(s) / ED Diagnoses Final diagnoses:  STD exposure  Penile discharge    Rx / DC Orders ED Discharge Orders         Ordered    doxycycline (VIBRAMYCIN) 100 MG capsule  2 times daily     Discontinue  Reprint     09/06/19 2009           Barnie Del 09/06/19 2022    Rolan Bucco, MD 09/07/19 [phone]

## 2019-09-07 LAB — SYPHILIS: RPR W/REFLEX TO RPR TITER AND TREPONEMAL ANTIBODIES, TRADITIONAL SCREENING AND DIAGNOSIS ALGORITHM: RPR Ser Ql: NONREACTIVE

## 2019-09-08 LAB — URINE CULTURE: Culture: NO GROWTH

## 2019-09-26 ENCOUNTER — Other Ambulatory Visit: Payer: Self-pay

## 2019-09-26 ENCOUNTER — Ambulatory Visit (HOSPITAL_COMMUNITY)
Admission: EM | Admit: 2019-09-26 | Discharge: 2019-09-26 | Disposition: A | Discharge status: Other Acute Inpatient Hospital | Payer: BC Managed Care – PPO

## 2019-10-12 ENCOUNTER — Emergency Department (HOSPITAL_COMMUNITY)
Admission: EM | Admit: 2019-10-12 | Discharge: 2019-10-13 | Disposition: A | Discharge status: Home / Self Care | Payer: No Typology Code available for payment source | Attending: Emergency Medicine | Admitting: Emergency Medicine

## 2019-10-12 ENCOUNTER — Other Ambulatory Visit: Payer: Self-pay

## 2019-10-12 DIAGNOSIS — S81812A Laceration without foreign body, left lower leg, initial encounter: Secondary | ICD-10-CM | POA: Insufficient documentation

## 2019-10-12 DIAGNOSIS — Y9269 Other specified industrial and construction area as the place of occurrence of the external cause: Secondary | ICD-10-CM | POA: Insufficient documentation

## 2019-10-12 DIAGNOSIS — W500XXA Accidental hit or strike by another person, initial encounter: Secondary | ICD-10-CM | POA: Insufficient documentation

## 2019-10-12 DIAGNOSIS — S8992XA Unspecified injury of left lower leg, initial encounter: Secondary | ICD-10-CM | POA: Diagnosis present

## 2019-10-12 NOTE — ED Triage Notes (Signed)
Emergency Medicine Provider Triage Evaluation Note  Paul [name] , a 49 y.o. male  was evaluated in triage.  Pt complains of L leg lac.  Just prior to arrival, [employer] fell and hit him in the back of the leg.  No significant pain or bleeding.  He is not immunocompromise.  Tetanus is up-to-date.  No numbness.  Review of Systems  Positive: wound Negative: numbness  Physical Exam  BP 130/84 (BP Location: Right Arm)   Pulse 64   Temp 98.6 F (37 C) (Oral)   Resp 16   SpO2 100%  Gen:   Awake, no distress   HEENT:  Atraumatic  Resp:  Normal effort  Cardiac:  Normal rate  Abd:   Nondistended, nontender  MSK:   Moves extremities without difficulty. 3 cm gaping lac of the posterior L lower leg. Fairly superficial Minimal bleeding.  Neuro:  Speech clear   Medical Decision Making  Medically screening exam initiated at 5:00 PM.  Appropriate orders placed.  Paul [name] was informed that the remainder of the evaluation will be completed by another provider, this initial triage assessment does not replace that evaluation, and the importance of remaining in the ED until their evaluation is complete.  Clinical Impression   Patient with left lower leg laceration.  Neurovascularly intact.  Tetanus is up-to-date.  This will likely need sutures.   Alveria Apley, PA-C 10/12/19 1704

## 2019-10-12 NOTE — ED Triage Notes (Signed)
C/o left leg laceration occurred at work today;

## 2019-10-13 MED ORDER — LIDOCAINE-EPINEPHRINE 1 %-1:100000 IJ SOLN
20.0000 mL | Freq: Once | INTRAMUSCULAR | Status: AC
  Administered 2019-10-13: 20 mL
  Filled 2019-10-13: qty 1

## 2019-10-13 NOTE — ED Notes (Signed)
Suture cart & lidocaine are ready & at pt bedside.
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
## 2019-10-13 NOTE — ED Notes (Signed)
Dressing applied to the Left calf area per EDP request. Dressing explained to pt, D/C papers given & questions answered.

## 2019-10-13 NOTE — Discharge Instructions (Signed)
Please cleanse wound daily with gentle soap and water and apply Neosporin over the skin to prevent infection.  Keep a dressing over the wound.  Have your sutures removed in 7 days.

## 2019-10-13 NOTE — ED Notes (Signed)
PA at bedside suturing at this time.

## 2019-10-15 ENCOUNTER — Other Ambulatory Visit: Payer: Self-pay

## 2019-10-15 ENCOUNTER — Encounter (HOSPITAL_COMMUNITY): Payer: Self-pay | Admitting: *Deleted

## 2019-10-15 ENCOUNTER — Emergency Department (HOSPITAL_COMMUNITY)
Admission: EM | Admit: 2019-10-15 | Discharge: 2019-10-15 | Disposition: A | Discharge status: Home / Self Care | Payer: BC Managed Care – PPO | Attending: Emergency Medicine | Admitting: Emergency Medicine

## 2019-10-15 DIAGNOSIS — Z4800 Encounter for change or removal of nonsurgical wound dressing: Secondary | ICD-10-CM | POA: Insufficient documentation

## 2019-10-15 DIAGNOSIS — Z5189 Encounter for other specified aftercare: Secondary | ICD-10-CM

## 2019-10-15 NOTE — Discharge Instructions (Signed)
Keep wound clean and dry. You may apply bacitracin to the wound twice daily. Take Motrin and Tylenol as needed as directed for pain. Follow up with your worker's comp provider for further care and suture removal.

## 2019-10-15 NOTE — ED Triage Notes (Signed)
Pt was seen here on 9/17 for laceration to left leg, had sutures placed. Reports not being given any pain meds and would like a prescription and a work note. Ambulatory and no distress noted at triage.
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
## 2019-10-21 ENCOUNTER — Other Ambulatory Visit: Payer: Self-pay

## 2019-10-21 ENCOUNTER — Encounter (HOSPITAL_COMMUNITY): Payer: Self-pay | Admitting: Emergency Medicine

## 2019-10-21 ENCOUNTER — Ambulatory Visit (HOSPITAL_COMMUNITY)
Admission: EM | Admit: 2019-10-21 | Discharge: 2019-10-21 | Disposition: A | Discharge status: Home / Self Care | Payer: BC Managed Care – PPO

## 2019-10-21 DIAGNOSIS — Z4802 Encounter for removal of sutures: Secondary | ICD-10-CM

## 2019-10-21 NOTE — ED Triage Notes (Signed)
Patient is in department for suture removal.    Patient has 7 sutures to posterior left lower leg.  Scab in tact.  Surrounding skin unremarkable.

## 2019-10-21 NOTE — ED Notes (Signed)
Discussed patient with Paul Endo, NP on arrival to treatment room.  Patient is a workers Occupational hygienist

## 2021-03-18 IMAGING — DX LEFT FOREARM - 2 VIEW
2 series · 2 of 2 positions shown · non-contrast
Comparison: None.

CLINICAL DATA: Soft tissue injury.

EXAM:
LEFT FOREARM - 2 VIEW

[forearm ap]
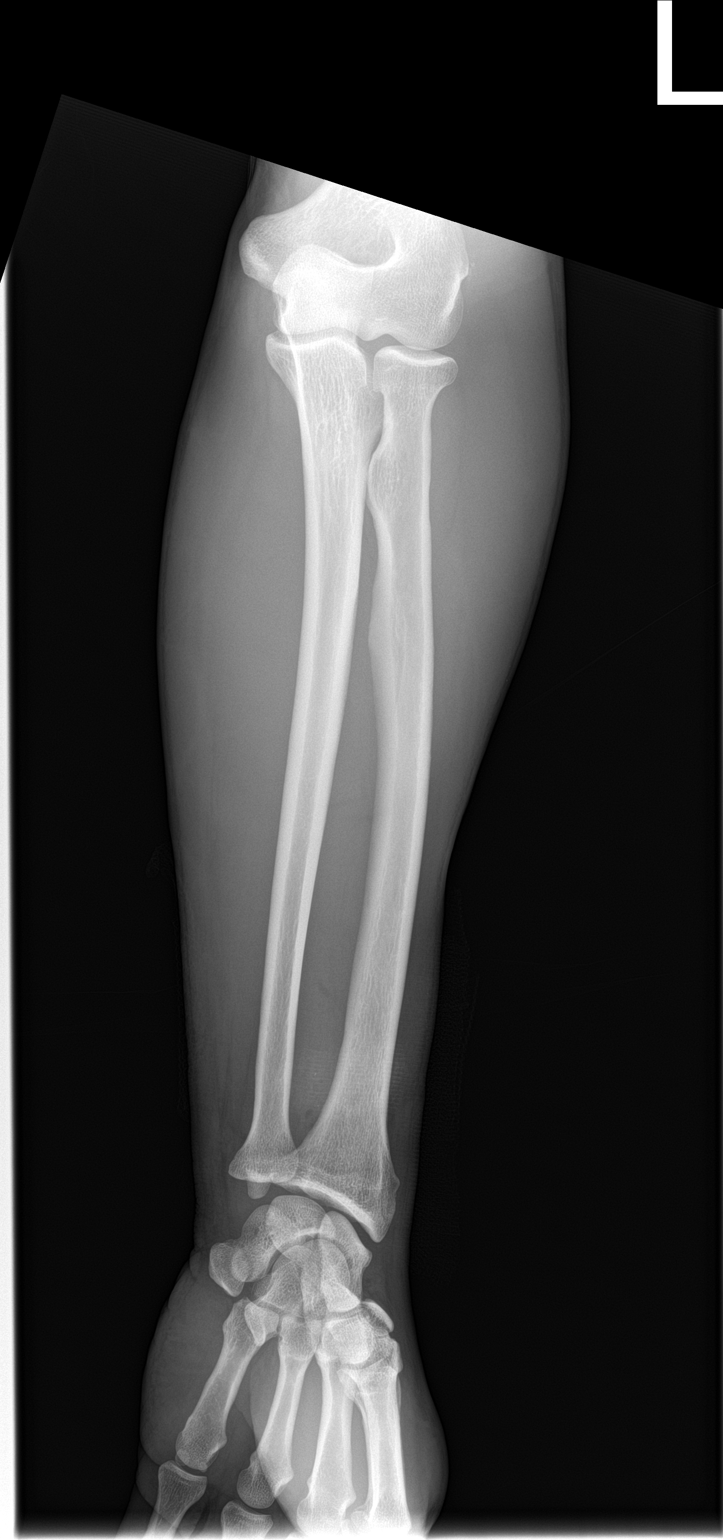

[forearm lat]
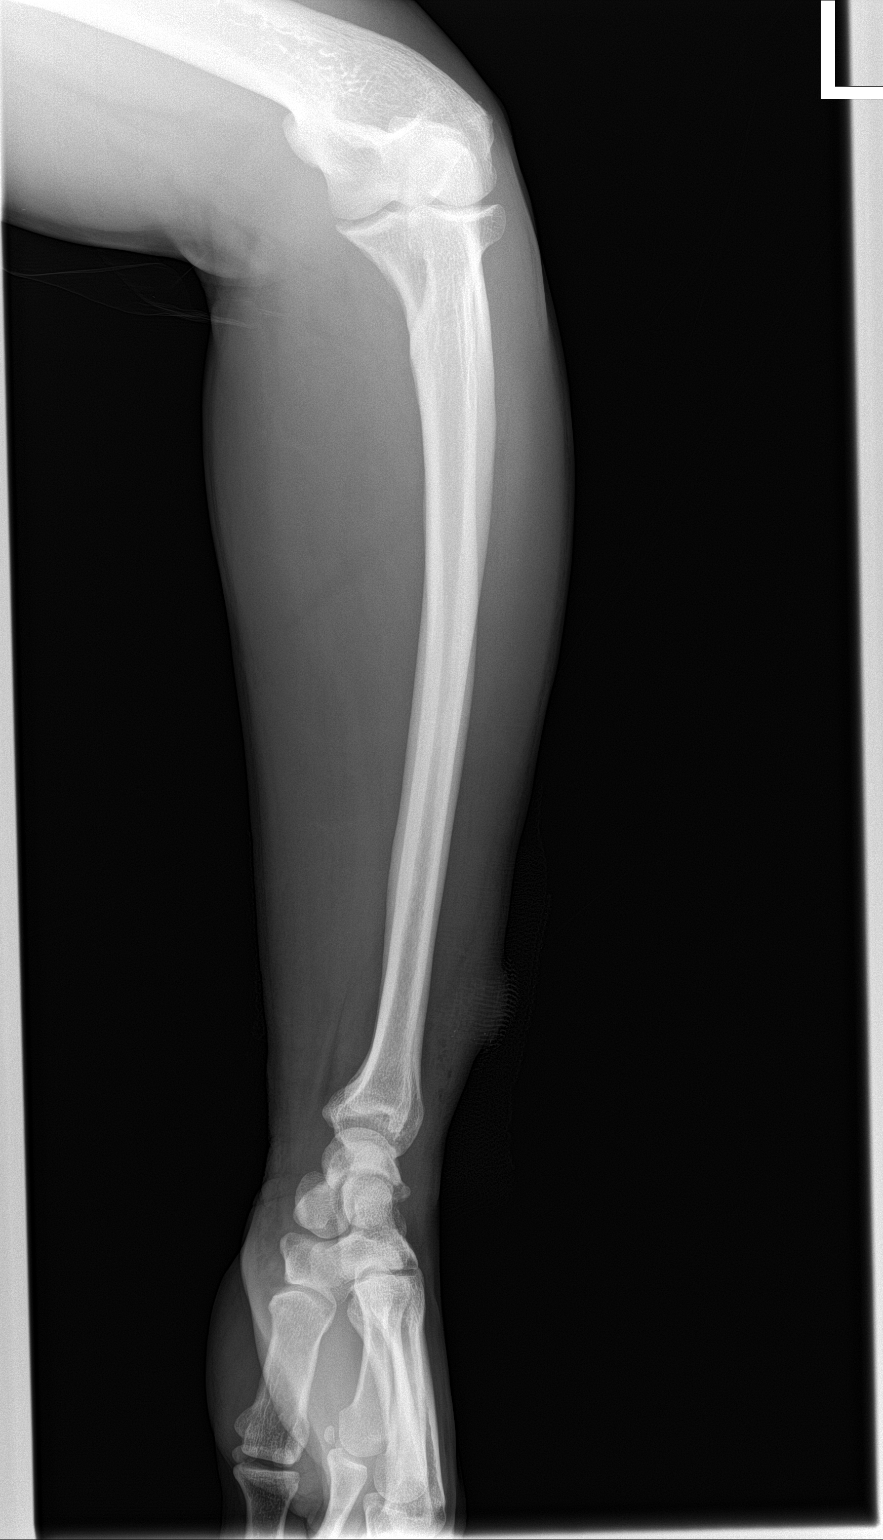

[2 of 2 positions shown; findings below may reference images not displayed]

FINDINGS: There is no evidence of fracture or other focal bone lesions. Soft
tissue injury to the dorsum of the proximal forearm.
IMPRESSION: No acute fracture or dislocation identified about the left forearm.

## 2023-01-22 ENCOUNTER — Other Ambulatory Visit: Payer: Self-pay

## 2023-01-22 ENCOUNTER — Encounter (HOSPITAL_COMMUNITY): Payer: Self-pay | Admitting: Emergency Medicine

## 2023-01-22 ENCOUNTER — Ambulatory Visit (HOSPITAL_COMMUNITY)
Admission: EM | Admit: 2023-01-22 | Discharge: 2023-01-22 | Disposition: A | Discharge status: Home / Self Care | Payer: Self-pay | Attending: Emergency Medicine | Admitting: Emergency Medicine

## 2023-01-22 DIAGNOSIS — S39012A Strain of muscle, fascia and tendon of lower back, initial encounter: Secondary | ICD-10-CM

## 2023-01-22 DIAGNOSIS — S161XXA Strain of muscle, fascia and tendon at neck level, initial encounter: Secondary | ICD-10-CM

## 2023-01-22 MED ORDER — PREDNISONE 10 MG PO TABS: 20.0000 mg | ORAL_TABLET | Freq: Every day | ORAL | 0 refills | Status: DC

## 2023-01-22 MED ORDER — DICLOFENAC SODIUM 75 MG PO TBEC: 75.0000 mg | DELAYED_RELEASE_TABLET | Freq: Two times a day (BID) | ORAL | 0 refills | Status: DC | PRN

## 2023-01-22 MED ORDER — BACLOFEN 10 MG PO TABS: 10.0000 mg | ORAL_TABLET | Freq: Three times a day (TID) | ORAL | 0 refills | Status: DC | PRN

## 2023-01-22 NOTE — Discharge Instructions (Addendum)
Take Diclofenac and baclofen as needed for muscle spasms and pain. Do not take ibuprofen or motrin with diclofenac. You can take tylenol 1000mg  three times daily if needed with the above medication Recommend Epsom salt soaks Ice on for 20 minutes to affected area every other hour as needed for pain.  Topical ointment and patches such as salon paz or aspercreme with lidocaine.    Follow-up with primary care provider as needed.   Consider massages as needed for adjunct pain control.
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
## 2023-05-17 ENCOUNTER — Other Ambulatory Visit: Payer: Self-pay

## 2023-05-17 ENCOUNTER — Encounter (HOSPITAL_COMMUNITY): Payer: Self-pay | Admitting: Emergency Medicine

## 2023-05-17 ENCOUNTER — Emergency Department (HOSPITAL_COMMUNITY)
Admission: EM | Admit: 2023-05-17 | Discharge: 2023-05-17 | Disposition: A | Discharge status: Home / Self Care | Attending: Emergency Medicine | Admitting: Emergency Medicine

## 2023-05-17 ENCOUNTER — Emergency Department (HOSPITAL_COMMUNITY)

## 2023-05-17 DIAGNOSIS — M25532 Pain in left wrist: Secondary | ICD-10-CM | POA: Insufficient documentation

## 2023-05-17 DIAGNOSIS — Y92410 Unspecified street and highway as the place of occurrence of the external cause: Secondary | ICD-10-CM | POA: Diagnosis not present

## 2023-05-17 DIAGNOSIS — M25531 Pain in right wrist: Secondary | ICD-10-CM | POA: Diagnosis not present

## 2023-05-17 DIAGNOSIS — S8012XA Contusion of left lower leg, initial encounter: Secondary | ICD-10-CM | POA: Insufficient documentation

## 2023-05-17 DIAGNOSIS — M79662 Pain in left lower leg: Secondary | ICD-10-CM | POA: Diagnosis present

## 2023-05-17 NOTE — Discharge Instructions (Addendum)
 Apply ice to the affected areas . You ma use Ibuprofen  and/or Tylenol as well.  If you develop new or worsening pain, headache, chest pain, trouble breathing, or any other new/concerning symptoms then return to the ER.
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
## 2023-07-15 ENCOUNTER — Encounter (HOSPITAL_COMMUNITY): Payer: Self-pay

## 2023-07-15 ENCOUNTER — Ambulatory Visit (HOSPITAL_COMMUNITY)
Admission: EM | Admit: 2023-07-15 | Discharge: 2023-07-15 | Disposition: A | Discharge status: Home / Self Care | Payer: Self-pay

## 2023-07-15 DIAGNOSIS — H6121 Impacted cerumen, right ear: Secondary | ICD-10-CM

## 2023-07-15 NOTE — ED Triage Notes (Signed)
 Patient presenting with right ear fullness onset 2 days ago. Denies any sick symptoms. States he thinks it is full of wax but unable to get any out.   Prescriptions or OTC medications tried: Yes- Peroxide    with no relief

## 2023-07-15 NOTE — Discharge Instructions (Addendum)
  1. Impacted cerumen of right ear (Primary) - Ear wax removal performed today in UC, right ear canal clear of cerumen impaction, TM pearly gray, mild injection, ear canal mildly red, no sign of secondary infection. - Continue to monitor for any secondary signs of infection such as ear pain, drainage from ears, fever, difficulty hearing. - Take ibuprofen  or Tylenol every 8 hours as needed for pain and inflammation to the right ear canal following cerumen impaction removal. -Continue to monitor symptoms for any change in severity if there is any escalation of current symptoms or development of new symptoms follow-up in ER for further evaluation and management.

## 2023-07-15 NOTE — ED Provider Notes (Signed)
 [facility]  Note:  This document was prepared using Dragon voice recognition software and may include unintentional dictation errors.  MRN: [id number] DOB: [birth year]  Subjective:   Paul [name] is a 53 y.o. male presenting for right ear fullness and difficulty hearing x 2 days.  Patient denies any nasal congestion, cough, body aches, fatigue, fever.  Patient states that he believes that his ear is blocked up with earwax but he cannot get any of it out.  Patient has tried peroxide at home with minimal relief.  Patient states he cannot hear anything out of his right ear.  Patient states that he did have cerumen impaction previously but has been several years.  Patient denies sticking anything in his right ear.  No current facility-administered medications for this encounter. No current outpatient medications on file.   No Known Allergies  History reviewed. No pertinent past medical history.   History reviewed. No pertinent surgical history.  Family History  Problem Relation Age of Onset   Hypertension Mother    Prostate cancer Father     Social History   Tobacco Use   Smoking status: Never   Smokeless tobacco: Never  Vaping Use   Vaping status: Never Used  Substance Use Topics   Alcohol use: Yes    Comment: weekends only   Drug use: No    ROS Refer to HPI for ROS details.  Objective:   Vitals: BP (!) 152/102 (BP Location: Right Arm)   Pulse (!) 55   Temp 98 F (36.7 C) (Oral)   Resp 16   Ht 5' 9 (1.753 m)   Wt 172 lb (78 kg)   SpO2 97%   BMI 25.40 kg/m   Physical Exam Vitals and nursing note reviewed.  Constitutional:      General: He is not in acute distress.    Appearance: Normal appearance. He is well-developed. He is not ill-appearing or toxic-appearing.  HENT:     Head: Normocephalic.     Right Ear: Ear canal and external ear normal. There is impacted cerumen.     Left Ear: Tympanic membrane, ear canal and external ear normal.      Nose: Nose normal.     Mouth/Throat:     Mouth: Mucous membranes are moist.     Pharynx: Oropharynx is clear. No oropharyngeal exudate or posterior oropharyngeal erythema.   Eyes:     General:        Right eye: No discharge.        Left eye: No discharge.     Extraocular Movements: Extraocular movements intact.     Conjunctiva/sclera: Conjunctivae normal.    Cardiovascular:     Rate and Rhythm: Normal rate.  Pulmonary:     Effort: Pulmonary effort is normal. No respiratory distress.   Skin:    General: Skin is warm and dry.   Neurological:     General: No focal deficit present.     Mental Status: He is alert and oriented to person, place, and time.   Psychiatric:        Mood and Affect: Mood normal.        Behavior: Behavior normal.     Procedures  No results found for this or any previous visit (from the past 24 hours).  No results found.   Assessment and Plan :     Discharge Instructions       1. Impacted cerumen of right ear (Primary) - Ear wax removal performed today in  UC, right ear canal clear of cerumen impaction, TM pearly gray, mild injection, ear canal mildly red, no sign of secondary infection. - Continue to monitor for any secondary signs of infection such as ear pain, drainage from ears, fever, difficulty hearing. - Take ibuprofen  or Tylenol every 8 hours as needed for pain and inflammation to the right ear canal following cerumen impaction removal. -Continue to monitor symptoms for any change in severity if there is any escalation of current symptoms or development of new symptoms follow-up in ER for further evaluation and management.      [name]   [name], [name], [name] 07/15/23 1240
